# Patient Record
Sex: Female | Born: 1985 | Hispanic: Yes | Marital: Married | Smoking: Never smoker
Health system: Southern US, Community
[De-identification: ages and names within clinical notes are randomized; demographics above are authoritative.]

## PROBLEM LIST (undated history)

## (undated) ENCOUNTER — Inpatient Hospital Stay (HOSPITAL_COMMUNITY): Payer: Self-pay

## (undated) DIAGNOSIS — F32A Depression, unspecified: Secondary | ICD-10-CM

## (undated) DIAGNOSIS — F419 Anxiety disorder, unspecified: Secondary | ICD-10-CM

## (undated) DIAGNOSIS — N189 Chronic kidney disease, unspecified: Secondary | ICD-10-CM

## (undated) HISTORY — PX: NO PAST SURGERIES: SHX2092

## (undated) HISTORY — DX: Chronic kidney disease, unspecified: N18.9

---

## 2006-06-30 ENCOUNTER — Emergency Department (HOSPITAL_COMMUNITY): Admission: EM | Admit: 2006-06-30 | Discharge: 2006-07-01 | Payer: Self-pay | Admitting: Emergency Medicine

## 2006-08-08 ENCOUNTER — Emergency Department (HOSPITAL_COMMUNITY): Admission: EM | Admit: 2006-08-08 | Discharge: 2006-08-09 | Payer: Self-pay | Admitting: Emergency Medicine

## 2007-04-20 ENCOUNTER — Emergency Department (HOSPITAL_COMMUNITY): Admission: EM | Admit: 2007-04-20 | Discharge: 2007-04-20 | Payer: Self-pay | Admitting: Family Medicine

## 2008-06-03 ENCOUNTER — Encounter (INDEPENDENT_AMBULATORY_CARE_PROVIDER_SITE_OTHER): Payer: Self-pay | Admitting: Internal Medicine

## 2008-06-10 ENCOUNTER — Ambulatory Visit: Payer: Self-pay | Admitting: Internal Medicine

## 2008-06-10 DIAGNOSIS — K047 Periapical abscess without sinus: Secondary | ICD-10-CM

## 2008-06-10 DIAGNOSIS — N949 Unspecified condition associated with female genital organs and menstrual cycle: Secondary | ICD-10-CM

## 2008-06-10 DIAGNOSIS — N390 Urinary tract infection, site not specified: Secondary | ICD-10-CM

## 2008-06-11 ENCOUNTER — Encounter (INDEPENDENT_AMBULATORY_CARE_PROVIDER_SITE_OTHER): Payer: Self-pay | Admitting: Internal Medicine

## 2008-06-12 ENCOUNTER — Encounter (INDEPENDENT_AMBULATORY_CARE_PROVIDER_SITE_OTHER): Payer: Self-pay | Admitting: Internal Medicine

## 2008-06-17 ENCOUNTER — Encounter (INDEPENDENT_AMBULATORY_CARE_PROVIDER_SITE_OTHER): Payer: Self-pay | Admitting: Internal Medicine

## 2008-08-19 ENCOUNTER — Ambulatory Visit: Payer: Self-pay | Admitting: Nurse Practitioner

## 2008-09-20 ENCOUNTER — Encounter (INDEPENDENT_AMBULATORY_CARE_PROVIDER_SITE_OTHER): Payer: Self-pay | Admitting: Internal Medicine

## 2008-09-20 ENCOUNTER — Ambulatory Visit: Payer: Self-pay | Admitting: Internal Medicine

## 2008-09-20 DIAGNOSIS — H5310 Unspecified subjective visual disturbances: Secondary | ICD-10-CM | POA: Insufficient documentation

## 2008-09-20 DIAGNOSIS — B373 Candidiasis of vulva and vagina: Secondary | ICD-10-CM | POA: Insufficient documentation

## 2008-09-20 DIAGNOSIS — H9190 Unspecified hearing loss, unspecified ear: Secondary | ICD-10-CM | POA: Insufficient documentation

## 2008-09-20 LAB — CONVERTED CEMR LAB
Chlamydia, DNA Probe: NEGATIVE
Whiff Test: POSITIVE

## 2008-09-29 ENCOUNTER — Encounter (INDEPENDENT_AMBULATORY_CARE_PROVIDER_SITE_OTHER): Payer: Self-pay | Admitting: Internal Medicine

## 2008-10-05 ENCOUNTER — Ambulatory Visit: Payer: Self-pay | Admitting: Internal Medicine

## 2008-10-05 LAB — CONVERTED CEMR LAB
Alkaline Phosphatase: 85 units/L (ref 39–117)
BUN: 11 mg/dL (ref 6–23)
Basophils Absolute: 0.1 10*3/uL (ref 0.0–0.1)
Basophils Relative: 1 % (ref 0–1)
Creatinine, Ser: 0.62 mg/dL (ref 0.40–1.20)
Eosinophils Relative: 1 % (ref 0–5)
Glucose, Bld: 78 mg/dL (ref 70–99)
HDL: 55 mg/dL (ref >39)
Hemoglobin: 14.3 g/dL (ref 12.0–15.0)
LDL Cholesterol: 110 mg/dL — ABNORMAL HIGH (ref 0–99)
MCHC: 33.3 g/dL (ref 30.0–36.0)
Monocytes Absolute: 0.4 10*3/uL (ref 0.1–1.0)
Neutro Abs: 3.7 10*3/uL (ref 1.7–7.7)
RDW: 13.8 % (ref 11.5–15.5)
Sodium: 142 meq/L (ref 135–145)
Total Bilirubin: 1.4 mg/dL — ABNORMAL HIGH (ref 0.3–1.2)
Total CHOL/HDL Ratio: 3.4
Triglycerides: 116 mg/dL (ref <150)
VLDL: 23 mg/dL (ref 0–40)

## 2008-10-22 ENCOUNTER — Encounter (INDEPENDENT_AMBULATORY_CARE_PROVIDER_SITE_OTHER): Payer: Self-pay | Admitting: Internal Medicine

## 2010-12-13 LAB — POCT URINALYSIS DIP (DEVICE)
Nitrite: NEGATIVE
Protein, ur: >=300 — AB
pH: 7

## 2012-01-03 ENCOUNTER — Ambulatory Visit: Payer: Self-pay | Admitting: Family Medicine

## 2012-01-03 VITALS — BP 108/74 | HR 78 | Temp 98.1°F | Resp 16 | Ht 67.0 in | Wt 197.0 lb

## 2012-01-03 DIAGNOSIS — F419 Anxiety disorder, unspecified: Secondary | ICD-10-CM

## 2012-01-03 DIAGNOSIS — R635 Abnormal weight gain: Secondary | ICD-10-CM

## 2012-01-03 DIAGNOSIS — N912 Amenorrhea, unspecified: Secondary | ICD-10-CM

## 2012-01-03 DIAGNOSIS — F411 Generalized anxiety disorder: Secondary | ICD-10-CM

## 2012-01-03 MED ORDER — CLONAZEPAM 0.5 MG PO TABS: 0.5000 mg | ORAL_TABLET | Freq: Two times a day (BID) | ORAL | Status: DC | PRN

## 2012-01-03 NOTE — Patient Instructions (Addendum)
[facility] [place]: [phone].  Can check with them about counseling for anxiety and stress management. Work on the Careers information officer including exercise.  You can take the clonazepam up to twice per day as needed for more severe anxiety symptoms. If you do become pregnant - stop this medicine until discussed with your health care provider. Continue the prenatal vitamins everyday. You can follow up with [facility] for the breast changes, or return to clinic after thyroid tests are back to discuss this further.  Your should receive a call or letter about your lab results within the next week to 10 days.

## 2012-01-03 NOTE — Progress Notes (Signed)
Subjective:    Patient ID: Whitney Bennett, female    DOB: 04-17-1985, 26 y.o.   MRN: [id number]  HPI Whitney Bennett is a 26 y.o. female No menses for past 3 months.  Took multiple pregnancy tests at home. - 2 months ago, 1 months ago, and yesterday.  Breast feel more sore, and appear bigger around areola. Seen by [facility] prior.  G1P1 - 52yo daughter.  Had IUD - Mirena - taken out 2 years ago, due to mood swings, thought had hormonal imbalance.  Tried OCP's few years ago - still had mood swings. Never had eval of mood swings. 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 Feels anxious due to work mostly, has felt more stressed overall past 6 months. 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 Dance helps stress - exercise only once per week.  Eating more with stress. No hx of therapist. Gaining weight past 6 months - some of this is stress eating.  Trying to become pregnant. Taking pnv qd.     Review of Systems  Constitutional: Positive for unexpected weight change.  Genitourinary: Positive for menstrual problem.  Psychiatric/Behavioral: Negative for suicidal ideas and dysphoric mood. The patient is nervous/anxious.    Notes some changes in areolar tissue.  Has noticed a few hairs around area as well.    Objective:   Physical Exam  Constitutional: She is oriented to person, place, and time. She appears well-developed and well-nourished.  HENT:  Head: Normocephalic and atraumatic.  Neck: Normal range of motion. No thyromegaly present.  Cardiovascular: Normal rate, regular rhythm, normal heart sounds and intact distal pulses.   Pulmonary/Chest: Effort normal.       Few small papular areas on areolas bilaterally, no erythema or peau d'orange changes noted.   Abdominal: Soft. Bowel sounds are normal. She exhibits no distension. There is no tenderness.  Lymphadenopathy:    She has no cervical adenopathy.  Neurological: She is alert and oriented to person, place, and time.  Skin: Skin is warm  and dry.  Psychiatric: She has a normal mood and affect. Her behavior is normal. Judgment and thought content normal.      Results for orders placed in visit on 01/03/12  POCT URINE PREGNANCY      Component Value Range   Preg Test, Ur Negative         Assessment & Plan:  Whitney Bennett is a 26 y.o. female 1. Amenorrhea  POCT urine pregnancy, TSH  2. Weight gain  TSH  3. Anxiety  clonazePAM (KLONOPIN) 0.5 MG tablet   Delayed menses.  Suspect stress component. With weight gain will check tsh, but likely stress eating component.  Discussed stress mgt techniques including exercise most days of week, relaxation techniques. Klonopin 0.5mg  bid prn increased sx's. Phone number given for counseling.  Continue PNV QD as trying to become pregnant. Can recheck hcg as outpatient every few weeks.  Stop klonopin if positive hcg.   Subjective breast changes - no concerning findings currently. Can follow up with [facility] or rtc if any new/worsening symptoms.   Patient Instructions  [facility] the [place]: [phone].  Can check with them about counseling for anxiety and stress management. Work on the Careers information officer including exercise.  You can take the clonazepam up to twice per day as needed for more severe anxiety symptoms. If you do become pregnant - stop this medicine until discussed with your health care provider. Continue the prenatal vitamins everyday. You can follow up  with [facility] for the breast changes, or return to clinic after thyroid tests are back to discuss this further.  Your should receive a call or letter about your lab results within the next week to 10 days.

## 2012-04-07 ENCOUNTER — Other Ambulatory Visit: Payer: Self-pay

## 2012-04-07 DIAGNOSIS — Z3200 Encounter for pregnancy test, result unknown: Secondary | ICD-10-CM

## 2012-04-13 ENCOUNTER — Encounter (HOSPITAL_COMMUNITY): Payer: Self-pay | Admitting: *Deleted

## 2012-04-13 ENCOUNTER — Inpatient Hospital Stay (HOSPITAL_COMMUNITY): Payer: Self-pay

## 2012-04-13 ENCOUNTER — Inpatient Hospital Stay (HOSPITAL_COMMUNITY)
Admission: AD | Admit: 2012-04-13 | Discharge: 2012-04-13 | Disposition: A | Discharge status: Home / Self Care | Payer: Self-pay | Source: Ambulatory Visit | Attending: Obstetrics & Gynecology | Admitting: Obstetrics & Gynecology

## 2012-04-13 DIAGNOSIS — R109 Unspecified abdominal pain: Secondary | ICD-10-CM | POA: Insufficient documentation

## 2012-04-13 DIAGNOSIS — O4690 Antepartum hemorrhage, unspecified, unspecified trimester: Secondary | ICD-10-CM

## 2012-04-13 DIAGNOSIS — O209 Hemorrhage in early pregnancy, unspecified: Secondary | ICD-10-CM | POA: Insufficient documentation

## 2012-04-13 HISTORY — DX: Anxiety disorder, unspecified: F41.9

## 2012-04-13 LAB — CBC
HCT: 38.9 % (ref 36.0–46.0)
Hemoglobin: 13.5 g/dL (ref 12.0–15.0)
MCV: 83.5 fL (ref 78.0–100.0)
RDW: 13.8 % (ref 11.5–15.5)
WBC: 11 10*3/uL — ABNORMAL HIGH (ref 4.0–10.5)

## 2012-04-13 LAB — URINE MICROSCOPIC-ADD ON

## 2012-04-13 LAB — URINALYSIS, ROUTINE W REFLEX MICROSCOPIC
Bilirubin Urine: NEGATIVE
Nitrite: NEGATIVE
Specific Gravity, Urine: 1.005 (ref 1.005–1.030)
pH: 7 (ref 5.0–8.0)

## 2012-04-13 LAB — WET PREP, GENITAL: Clue Cells Wet Prep HPF POC: NONE SEEN

## 2012-04-13 NOTE — MAU Provider Note (Signed)
History     CSN: [id number]  Arrival date and time: 04/13/12 1115   First Provider Initiated Contact with Patient 04/13/12 1228      Chief Complaint  Patient presents with  . Vaginal Bleeding  . Abdominal Pain   HPI Whitney Bennett is a 27 y.o. female @ [redacted]w[redacted]d gestation who presents to MAU with vaginal bleeding. The bleeding started today. She describes the bleeding as spotting. Associated symptoms include lower abdominal cramping. Patient states periods are irregular so not sure how far along she is. Took pregnancy test in November that was negative. Last week had positive. Last pap smear one year ago and was normal. Hx of trichomonas. Current sex partner x 6 years. The history was provided by the patient.   OB History    Grav Para Term Preterm Abortions TAB SAB Ect Mult Living   2 1 1       1       Past Medical History  Diagnosis Date  . Anxiety     Past Surgical History  Procedure Date  . No past surgeries     Family History  Problem Relation Age of Onset  . Other Neg Hx     History  Substance Use Topics  . Smoking status: Never Smoker   . Smokeless tobacco: Not on file  . Alcohol Use: 0.5 - 1.0 oz/week    1-2 drink(s) per week    Allergies: No Known Allergies  Prescriptions prior to admission  Medication Sig Dispense Refill  . Prenatal Vit-Fe Fumarate-FA (PRENATAL MULTIVITAMIN) TABS Take 1 tablet by mouth daily.        Review of Systems  Constitutional: Negative for fever, chills and weight loss.  HENT: Negative for ear pain, nosebleeds, congestion, sore throat and neck pain.   Eyes: Negative for blurred vision, double vision, photophobia and pain.  Respiratory: Negative for cough, shortness of breath and wheezing.   Cardiovascular: Negative for chest pain, palpitations and leg swelling.  Gastrointestinal: Positive for nausea and vomiting. Negative for heartburn, abdominal pain, diarrhea and constipation.  Genitourinary: Positive for frequency.  Negative for dysuria and urgency.  Musculoskeletal: Negative for myalgias and back pain.  Skin: Negative for itching and rash.  Neurological: Negative for dizziness, sensory change, speech change, seizures, weakness and headaches.  Endo/Heme/Allergies: Does not bruise/bleed easily.  Psychiatric/Behavioral: Negative for depression and substance abuse. The patient is not nervous/anxious (hx of anxiety, no medication) and does not have insomnia.    Blood pressure 113/68, pulse 86, temperature 98.1 F (36.7 C), temperature source Oral, resp. rate 16, height 5\' 6"  (1.676 m), weight 194 lb (87.998 kg), last menstrual period 01/16/2012.  Physical Exam  Nursing note and vitals reviewed. Constitutional: She is oriented to person, place, and time. She appears well-developed and well-nourished. No distress.  HENT:  Head: Normocephalic and atraumatic.  Eyes: EOM are normal.  Neck: Neck supple.  Cardiovascular: Normal rate.   Respiratory: Effort normal.  GI: Soft. There is no tenderness.       Unable to doppler FHT's  Genitourinary:       External genitalia without lesions, scant blood vaginal vault. Cervix long, closed, no CMT, uterus approximately 8 to 10 week size.   Musculoskeletal: Normal range of motion.  Neurological: She is alert and oriented to person, place, and time.  Skin: Skin is warm and dry.  Psychiatric: She has a normal mood and affect. Her behavior is normal. Judgment and thought content normal.   Results for orders placed  during the hospital encounter of 04/13/12 (from the past 24 hour(s))  URINALYSIS, ROUTINE W REFLEX MICROSCOPIC     Status: Abnormal   Collection Time   04/13/12 11:35 AM      Component Value Range   Color, Urine YELLOW  YELLOW   APPearance CLEAR  CLEAR   Specific Gravity, Urine 1.005  1.005 - 1.030   pH 7.0  5.0 - 8.0   Glucose, UA NEGATIVE  NEGATIVE mg/dL   Hgb urine dipstick MODERATE (*) NEGATIVE   Bilirubin Urine NEGATIVE  NEGATIVE   Ketones, ur  NEGATIVE  NEGATIVE mg/dL   Protein, ur 100 (*) NEGATIVE mg/dL   Urobilinogen, UA 0.2  0.0 - 1.0 mg/dL   Nitrite NEGATIVE  NEGATIVE   Leukocytes, UA SMALL (*) NEGATIVE  URINE MICROSCOPIC-ADD ON     Status: Abnormal   Collection Time   04/13/12 11:35 AM      Component Value Range   Squamous Epithelial / LPF FEW (*) RARE   WBC, UA 3-6  <3 WBC/hpf   RBC / HPF 0-2  <3 RBC/hpf   Bacteria, UA FEW (*) RARE  ABO/RH     Status: Normal   Collection Time   04/13/12  1:20 PM      Component Value Range   ABO/RH(D) O POS    CBC     Status: Abnormal   Collection Time   04/13/12  1:20 PM      Component Value Range   WBC 11.0 (*) 4.0 - 10.5 K/uL   RBC 4.66  3.87 - 5.11 MIL/uL   Hemoglobin 13.5  12.0 - 15.0 g/dL   HCT 38.9  36.0 - 46.0 %   MCV 83.5  78.0 - 100.0 fL   MCH 29.0  26.0 - 34.0 pg   MCHC 34.7  30.0 - 36.0 g/dL   RDW 13.8  11.5 - 15.5 %   Platelets 244  150 - 400 K/uL  HCG, QUANTITATIVE, PREGNANCY     Status: Abnormal   Collection Time   04/13/12  1:21 PM      Component Value Range   hCG, Beta Chain, Laqueta Carina [id number] (*) <5 mIU/mL  WET PREP, GENITAL     Status: Abnormal   Collection Time   04/13/12  1:45 PM      Component Value Range   Yeast Wet Prep HPF POC NONE SEEN  NONE SEEN   Trich, Wet Prep NONE SEEN  NONE SEEN   Clue Cells Wet Prep HPF POC NONE SEEN  NONE SEEN   WBC, Wet Prep HPF POC MANY (*) NONE SEEN    Assessment: 27 y.o. female @ approximately [redacted] weeks gestation with vaginal bleeding   [facility]  Plan:  Start prenatal care   Instruction on threatened AB   Return here as needed.    Procedures   NEESE,HOPE, RN, FNP, [facility] 04/13/2012, 12:28 PM

## 2012-04-13 NOTE — MAU Note (Signed)
C/o spotting and some mild cramping; pain started this Am around 1000;

## 2012-04-13 NOTE — MAU Provider Note (Signed)
Attestation of Attending Supervision of Advanced Practitioner (CNM/NP): Evaluation and management procedures were performed by the Advanced Practitioner under my supervision and collaboration.  I have reviewed the Advanced Practitioner's note and chart, and I agree with the management and plan.  HARRAWAY-SMITH, Jeannene Tschetter 7:52 PM

## 2012-04-14 LAB — GC/CHLAMYDIA PROBE AMP
CT Probe RNA: NEGATIVE
GC Probe RNA: NEGATIVE

## 2012-04-16 LAB — URINE CULTURE

## 2012-07-13 ENCOUNTER — Emergency Department (HOSPITAL_COMMUNITY)
Admission: EM | Admit: 2012-07-13 | Discharge: 2012-07-13 | Disposition: A | Discharge status: Home / Self Care | Payer: No Typology Code available for payment source | Attending: Emergency Medicine | Admitting: Emergency Medicine

## 2012-07-13 ENCOUNTER — Encounter (HOSPITAL_COMMUNITY): Payer: Self-pay | Admitting: Emergency Medicine

## 2012-07-13 DIAGNOSIS — J302 Other seasonal allergic rhinitis: Secondary | ICD-10-CM

## 2012-07-13 DIAGNOSIS — J309 Allergic rhinitis, unspecified: Secondary | ICD-10-CM | POA: Insufficient documentation

## 2012-07-13 DIAGNOSIS — Z331 Pregnant state, incidental: Secondary | ICD-10-CM | POA: Insufficient documentation

## 2012-07-13 DIAGNOSIS — Z8659 Personal history of other mental and behavioral disorders: Secondary | ICD-10-CM | POA: Insufficient documentation

## 2012-07-13 NOTE — ED Notes (Addendum)
Pt reports sore throat onset Friday. Pt c/o shortness of breath and  feeling like throat closing up since 0400. Pt tried tylenol and Vicks vapor rub without relief. Pt currently 5 months pregnant, denies pain in abdomen or discharge.

## 2012-07-13 NOTE — ED Provider Notes (Signed)
History     CSN: [id number]  Arrival date & time 07/13/12  [id number]   First MD Initiated Contact with Patient 07/13/12 [phone]      Chief Complaint  Patient presents with  . Sore Throat    (Consider location/radiation/quality/duration/timing/severity/associated sxs/prior treatment) HPI Comments: Whitney Bennett is a 27 y.o. Female who is here for evaluation of periods of feeling like her throat is closing. The episodes occur spontaneously, and get better when she applies a menthol, rub, to her chest. She has associated sneezing. She denies fever, chills, nausea, vomiting, numbness, or dizziness. She has had some mild sore throat. She currently has an uncomplicated five-month pregnancy. This is her second pregnancy. There are no known modifying factors.  Patient is a 27 y.o. female presenting with pharyngitis. The history is provided by the patient.  Sore Throat    Past Medical History  Diagnosis Date  . Anxiety     Past Surgical History  Procedure Laterality Date  . No past surgeries      Family History  Problem Relation Age of Onset  . Other Neg Hx     History  Substance Use Topics  . Smoking status: Never Smoker   . Smokeless tobacco: Not on file  . Alcohol Use: No     Comment: Quit Nov 2013 due to pregnancy    OB History   Grav Para Term Preterm Abortions TAB SAB Ect Mult Living   2 1 1       1       Review of Systems  All other systems reviewed and are negative.    Allergies  Review of patient's allergies indicates no known allergies.  Home Medications   Current Outpatient Rx  Name  Route  Sig  Dispense  Refill  . Prenatal Vit-Fe Fumarate-FA (PRENATAL MULTIVITAMIN) TABS   Oral   Take 1 tablet by mouth daily.           BP 106/68  Pulse 98  Temp(Src) 98.3 F (36.8 C) (Oral)  Resp 22  SpO2 100%  LMP 01/16/2012  Physical Exam  Nursing note and vitals reviewed. Constitutional: She is oriented to person, place, and time. She appears  well-developed and well-nourished.  HENT:  Head: Normocephalic and atraumatic.  Eyes: Conjunctivae and EOM are normal. Pupils are equal, round, and reactive to light. Right eye exhibits no discharge. Left eye exhibits no discharge. No scleral icterus.  Neck: Normal range of motion and phonation normal. Neck supple.  Cardiovascular: Normal rate, regular rhythm and intact distal pulses.   Pulmonary/Chest: Effort normal and breath sounds normal. No respiratory distress. She has no wheezes. She has no rales. She exhibits no tenderness.  Abdominal: Soft.  gravid  Musculoskeletal: Normal range of motion.  Neurological: She is alert and oriented to person, place, and time. She has normal strength. She exhibits normal muscle tone.  Skin: Skin is warm and dry.  Psychiatric: She has a normal mood and affect. Her behavior is normal. Judgment and thought content normal.    ED Course  Procedures (including critical care time)   Nursing Notes Reviewed/ Care Coordinated, and agree without changes. Applicable Imaging Reviewed.  Interpretation of Laboratory Data incorporated into ED treatment   1. Allergic rhinitis, seasonal       MDM  Symptoms are consistent with seasonal allergy. No systemic symptoms. Doubt arthritis, asthma, pneumonia, pregnancy complication. She is stable for discharge    Plan: Home Medications- Claritin; Home Treatments- rest, stay indoors; Recommended follow up-  OB, as scheduled          Richarda Blade, MD 07/13/12 [phone]

## 2012-08-07 ENCOUNTER — Inpatient Hospital Stay (HOSPITAL_COMMUNITY)
Admission: AD | Admit: 2012-08-07 | Discharge: 2012-08-07 | Disposition: A | Discharge status: Home / Self Care | Payer: No Typology Code available for payment source | Source: Ambulatory Visit | Attending: Obstetrics & Gynecology | Admitting: Obstetrics & Gynecology

## 2012-08-07 ENCOUNTER — Encounter (HOSPITAL_COMMUNITY): Payer: Self-pay

## 2012-08-07 ENCOUNTER — Encounter (HOSPITAL_COMMUNITY): Payer: Self-pay | Admitting: Emergency Medicine

## 2012-08-07 ENCOUNTER — Emergency Department (INDEPENDENT_AMBULATORY_CARE_PROVIDER_SITE_OTHER)
Admission: EM | Admit: 2012-08-07 | Discharge: 2012-08-07 | Disposition: A | Discharge status: Home / Self Care | Payer: No Typology Code available for payment source | Source: Home / Self Care | Attending: Family Medicine | Admitting: Family Medicine

## 2012-08-07 DIAGNOSIS — N949 Unspecified condition associated with female genital organs and menstrual cycle: Secondary | ICD-10-CM

## 2012-08-07 DIAGNOSIS — R319 Hematuria, unspecified: Secondary | ICD-10-CM | POA: Insufficient documentation

## 2012-08-07 DIAGNOSIS — R109 Unspecified abdominal pain: Secondary | ICD-10-CM | POA: Insufficient documentation

## 2012-08-07 DIAGNOSIS — O99891 Other specified diseases and conditions complicating pregnancy: Secondary | ICD-10-CM | POA: Insufficient documentation

## 2012-08-07 DIAGNOSIS — O26899 Other specified pregnancy related conditions, unspecified trimester: Secondary | ICD-10-CM

## 2012-08-07 LAB — URINALYSIS, ROUTINE W REFLEX MICROSCOPIC
Bilirubin Urine: NEGATIVE
Glucose, UA: NEGATIVE mg/dL
Specific Gravity, Urine: 1.01 (ref 1.005–1.030)
pH: 6.5 (ref 5.0–8.0)

## 2012-08-07 LAB — URINE MICROSCOPIC-ADD ON

## 2012-08-07 NOTE — ED Notes (Signed)
Pt adv to f/u at [facility] and to go directly over there.. Pt verbalized understanding.

## 2012-08-07 NOTE — ED Notes (Signed)
Pt c/o abd cramping onset 2 days w/vag d/c; she is [redacted] weeks pregnant abd cramps last for about 5 minutes Denies: dysuria, hematuria, f/v/n/d Was told by GYN to f/u if this keeps bothering her  She is alert and oriented w/no signs of acute distress.

## 2012-08-07 NOTE — ED Provider Notes (Signed)
History     CSN: [id number]  Arrival date & time 08/07/12  1502   First MD Initiated Contact with Patient 08/07/12 1720      Chief Complaint  Patient presents with  . Abdominal Cramping    (Consider location/radiation/quality/duration/timing/severity/associated sxs/prior treatment) Patient is a 27 y.o. female presenting with cramps. The history is provided by the patient.  Abdominal Cramping This is a new problem. The current episode started 2 days ago. The problem has been gradually worsening. Associated symptoms include abdominal pain.    Past Medical History  Diagnosis Date  . Anxiety     Past Surgical History  Procedure Laterality Date  . No past surgeries      Family History  Problem Relation Age of Onset  . Other Neg Hx     History  Substance Use Topics  . Smoking status: Never Smoker   . Smokeless tobacco: Not on file  . Alcohol Use: No     Comment: Quit Nov 2013 due to pregnancy    OB History   Grav Para Term Preterm Abortions TAB SAB Ect Mult Living   2 1 1       1       Review of Systems  Constitutional: Negative.   Gastrointestinal: Positive for abdominal pain.  Genitourinary: Positive for vaginal discharge and pelvic pain. Negative for urgency, frequency, hematuria and vaginal bleeding.       25 wk preg    Allergies  Review of patient's allergies indicates no known allergies.  Home Medications   No current outpatient prescriptions on file.  BP 112/73  Pulse 91  Temp(Src) 97.8 F (36.6 C) (Oral)  Resp 20  SpO2 97%  LMP 01/16/2012  Physical Exam  Nursing note and vitals reviewed. Constitutional: She is oriented to person, place, and time. She appears well-developed and well-nourished.  Abdominal: Soft. Bowel sounds are normal. She exhibits no mass. There is no tenderness. There is no rebound and no guarding.  Gravid uterus  Neurological: She is alert and oriented to person, place, and time.  Skin: Skin is warm and dry.    ED  Course  Procedures (including critical care time)  Labs Reviewed - No data to display No results found.   1. Abdominal pain complicating pregnancy       MDM          Billy Fischer, MD 08/07/12 2052

## 2012-08-07 NOTE — MAU Note (Signed)
Abdominal cramping since yesterday. Seen at [facility] today and checked u/a which showed blood in urine. Told to come here to be checked

## 2012-08-07 NOTE — MAU Provider Note (Signed)
  History     CSN: [id number]  Arrival date and time: 08/07/12 1858   None     Chief Complaint  Patient presents with  . Abdominal Cramping   HPI Ms Whitney Bennett is a 27yo G2P1001 at 25.2wks who presents from [facility] for further eval of abd cramping occuring a couple of times/hr x 2d. Denies dysuria, bldg, leak. No N/V/D or fever. She sees Dr Micah Noel for prenatal care which has been essentially unremarkable.  OB History   Grav Para Term Preterm Abortions TAB SAB Ect Mult Living   2 1 1       1       Past Medical History  Diagnosis Date  . Anxiety     Past Surgical History  Procedure Laterality Date  . No past surgeries      Family History  Problem Relation Age of Onset  . Other Neg Hx     History  Substance Use Topics  . Smoking status: Never Smoker   . Smokeless tobacco: Not on file  . Alcohol Use: No     Comment: Quit Nov 2013 due to pregnancy    Allergies: No Known Allergies  Prescriptions prior to admission  Medication Sig Dispense Refill  . loratadine (CLARITIN) 10 MG tablet Take 10 mg by mouth daily.      . Prenatal Vit-Fe Fumarate-FA (PRENATAL MULTIVITAMIN) TABS Take 1 tablet by mouth daily.        ROS Physical Exam   Blood pressure 117/64, pulse 113, temperature 97.9 F (36.6 C), resp. rate 20, height 5\' 3"  (1.6 m), weight 207 lb 12.8 oz (94.257 kg), last menstrual period 01/16/2012, SpO2 100.00%.  Physical Exam  Constitutional: She is oriented to person, place, and time. She appears well-developed.  HENT:  Head: Normocephalic.  Cardiovascular:  Sl tachycardic upon arrival  Respiratory: Effort normal.  GI: Soft. There is no CVA tenderness.  FHR 135s +accels, no decels No ctx per toco  Genitourinary: Vagina normal.  Cx C/L/post  Musculoskeletal: Normal range of motion.  Neurological: She is alert and oriented to person, place, and time.  Skin: Skin is warm and dry.  Psychiatric: She has a normal mood and affect. Her behavior is normal.  Thought content normal.   Urinalysis    Component Value Date/Time   COLORURINE YELLOW 08/07/2012 2040   APPEARANCEUR CLEAR 08/07/2012 2040   LABSPEC 1.010 08/07/2012 2040   PHURINE 6.5 08/07/2012 2040   GLUCOSEU NEGATIVE 08/07/2012 2040   HGBUR MODERATE* 08/07/2012 2040   BILIRUBINUR NEGATIVE 08/07/2012 2040   KETONESUR NEGATIVE 08/07/2012 2040   PROTEINUR 30* 08/07/2012 2040   UROBILINOGEN 0.2 08/07/2012 2040   NITRITE NEGATIVE 08/07/2012 2040   LEUKOCYTESUR SMALL* 08/07/2012 2040      MAU Course  Procedures    Assessment and Plan  IUP at 25.2wks Round lig pain Hematuria- asymptomatic  D/C home with comfort tips rev'd Urine to culture- will call if result + F/U as scheduled with Dr Murray Hodgkins, [facility] 08/07/2012, 8:44 PM

## 2012-08-09 LAB — URINE CULTURE

## 2012-10-02 ENCOUNTER — Encounter (HOSPITAL_COMMUNITY): Payer: Self-pay

## 2012-10-02 ENCOUNTER — Inpatient Hospital Stay (HOSPITAL_COMMUNITY)
Admission: AD | Admit: 2012-10-02 | Discharge: 2012-10-02 | Disposition: A | Discharge status: Home / Self Care | Payer: No Typology Code available for payment source | Source: Ambulatory Visit | Attending: Obstetrics & Gynecology | Admitting: Obstetrics & Gynecology

## 2012-10-02 DIAGNOSIS — R109 Unspecified abdominal pain: Secondary | ICD-10-CM

## 2012-10-02 DIAGNOSIS — O99891 Other specified diseases and conditions complicating pregnancy: Secondary | ICD-10-CM | POA: Insufficient documentation

## 2012-10-02 DIAGNOSIS — R197 Diarrhea, unspecified: Secondary | ICD-10-CM | POA: Insufficient documentation

## 2012-10-02 DIAGNOSIS — M549 Dorsalgia, unspecified: Secondary | ICD-10-CM | POA: Insufficient documentation

## 2012-10-02 DIAGNOSIS — O26899 Other specified pregnancy related conditions, unspecified trimester: Secondary | ICD-10-CM

## 2012-10-02 LAB — URINE MICROSCOPIC-ADD ON

## 2012-10-02 LAB — URINALYSIS, ROUTINE W REFLEX MICROSCOPIC
Nitrite: NEGATIVE
Specific Gravity, Urine: 1.025 (ref 1.005–1.030)
Urobilinogen, UA: 0.2 mg/dL (ref 0.0–1.0)

## 2012-10-02 NOTE — MAU Provider Note (Signed)
History     CSN: [id number]  Arrival date and time: 10/02/12 1452   None     Chief Complaint  Patient presents with  . Abdominal Pain   HPI 27 y.o. G2P1001 at [redacted]w[redacted]d here for abdominal/back pain. Pain started last night, primarily in back but also around lower abdomen. Comes in waves, about q17mins, lasts 15mins. The frequency and duration has decreased today. She also had some small amount of spotting yesterday. No loss of fluid. Still feels the baby moving. She has a history of frequent UTIs, of which she was just treated for one. Denies chest pain, SOB, headache, dizziness, changes in vision  Prenatal course: Care with Dr. Marcheta Grammes at [facility] - No records here, but patient states no problems with pregnancy other than UTIs  OB History   Grav Para Term Preterm Abortions TAB SAB Ect Mult Living   2 1 1       1       Past Medical History  Diagnosis Date  . Anxiety     Past Surgical History  Procedure Laterality Date  . No past surgeries      Family History  Problem Relation Age of Onset  . Other Neg Hx     History  Substance Use Topics  . Smoking status: Never Smoker   . Smokeless tobacco: Not on file  . Alcohol Use: No     Comment: Quit Nov 2013 due to pregnancy    Allergies: No Known Allergies  Prescriptions prior to admission  Medication Sig Dispense Refill  . amoxicillin (AMOXIL) 500 MG capsule Take 500 mg by mouth 3 (three) times daily.      Marland Kitchen loratadine (CLARITIN) 10 MG tablet Take 10 mg by mouth daily as needed for allergies.       . Prenatal Vit-Fe Fumarate-FA (PRENATAL MULTIVITAMIN) TABS Take 1 tablet by mouth daily.        ROS Physical Exam   Blood pressure 121/68, pulse 94, temperature 98 F (36.7 C), temperature source Oral, resp. rate 18, height 5' 4.25" (1.632 m), weight 97.977 kg (216 lb), last menstrual period 01/16/2012.  Physical Exam General appearance: alert, cooperative and no distress Head: Normocephalic, without obvious abnormality,  atraumatic Back: no CVA tenderness, no tenderness along spine Lungs: clear to auscultation bilaterally Heart: regular rate and rhythm, S1, S2 normal, no murmur, click, rub or gallop Abdomen: gravid, nontender to palpation, fundal height consistent with GA Pelvic: cervix normal in appearance and external genitalia normal Extremities: edema 1+ to mid lower leg bilaterally Pulses: 2+ and symmetric  Spec: greenish white thick mucous, no blood, cervix closed  Dilation: Closed Effacement (%): Thick Exam by:: Dr. Lamar Benes  FHT: 145bpm, mod var, 15x15 accels present, 1 variable decel down to 105 Toco: no ctx  MAU Course  Procedures Results for orders placed during the hospital encounter of 10/02/12 (from the past 24 hour(s))  URINALYSIS, ROUTINE W REFLEX MICROSCOPIC     Status: Abnormal   Collection Time    10/02/12  3:25 PM      Result Value Range   Color, Urine YELLOW  YELLOW   APPearance HAZY (*) CLEAR   Specific Gravity, Urine 1.025  1.005 - 1.030   pH 7.0  5.0 - 8.0   Glucose, UA NEGATIVE  NEGATIVE mg/dL   Hgb urine dipstick MODERATE (*) NEGATIVE   Bilirubin Urine NEGATIVE  NEGATIVE   Ketones, ur NEGATIVE  NEGATIVE mg/dL   Protein, ur 100 (*) NEGATIVE mg/dL   Urobilinogen,  UA 0.2  0.0 - 1.0 mg/dL   Nitrite NEGATIVE  NEGATIVE   Leukocytes, UA TRACE (*) NEGATIVE  URINE MICROSCOPIC-ADD ON     Status: Abnormal   Collection Time    10/02/12  3:25 PM      Result Value Range   Squamous Epithelial / LPF MANY (*) RARE   WBC, UA 7-10  <3 WBC/hpf   RBC / HPF 0-2  <3 RBC/hpf  WET PREP, GENITAL     Status: Abnormal   Collection Time    10/02/12  5:10 PM      Result Value Range   Yeast Wet Prep HPF POC NONE SEEN  NONE SEEN   Trich, Wet Prep NONE SEEN  NONE SEEN   Clue Cells Wet Prep HPF POC NONE SEEN  NONE SEEN   WBC, Wet Prep HPF POC MANY (*) NONE SEEN    MDM   Assessment and Plan  27 y.o. G2P1001 [redacted]w[redacted]d   UA negative for UTI Wet prep negative FHT reassurring  Pain likely  exacerbated by recent stress at work  Stable for discharge  Tawanna Sat 10/02/2012, 4:56 PM   I agree with above resident note. I reviewed history, imaging, labs, and vitals. I personally reviewed the fetal heart tracing, and it is reactive. Martha Clan, MD

## 2012-10-02 NOTE — MAU Note (Signed)
Baby dropped a few days ago.  Has been feeling cramping since yesterday, doesn't know if contracting.  Also having low back ache, recently treated  For UTI.  Has been having diarrhea.

## 2012-10-02 NOTE — MAU Note (Signed)
See Dr. Acquanetta Sit in [facility] for prenatal care. Denies loss of fluid, has had recurrent UTIs during pregnancy.

## 2012-10-03 LAB — GC/CHLAMYDIA PROBE AMP: GC Probe RNA: NEGATIVE

## 2012-10-05 NOTE — MAU Provider Note (Signed)
Attestation of Attending Supervision of Obstetric Fellow: Evaluation and management procedures were performed by the Obstetric Fellow under my supervision and collaboration.  I have reviewed the Obstetric Fellow's note and chart, and I agree with the management and plan.  Charlii Yost, MD, FACOG Attending Obstetrician & Gynecologist Faculty Practice, [facility]   

## 2012-11-16 ENCOUNTER — Inpatient Hospital Stay (HOSPITAL_COMMUNITY)
Admission: AD | Admit: 2012-11-16 | Discharge: 2012-11-16 | Disposition: A | Discharge status: Home / Self Care | Payer: Self-pay | Source: Ambulatory Visit | Attending: Obstetrics & Gynecology | Admitting: Obstetrics & Gynecology

## 2012-11-16 ENCOUNTER — Inpatient Hospital Stay (HOSPITAL_COMMUNITY): Payer: Self-pay

## 2012-11-16 ENCOUNTER — Encounter (HOSPITAL_COMMUNITY): Payer: Self-pay | Admitting: *Deleted

## 2012-11-16 DIAGNOSIS — O99891 Other specified diseases and conditions complicating pregnancy: Secondary | ICD-10-CM | POA: Insufficient documentation

## 2012-11-16 DIAGNOSIS — R109 Unspecified abdominal pain: Secondary | ICD-10-CM

## 2012-11-16 DIAGNOSIS — O368132 Decreased fetal movements, third trimester, fetus 2: Secondary | ICD-10-CM

## 2012-11-16 DIAGNOSIS — O479 False labor, unspecified: Secondary | ICD-10-CM

## 2012-11-16 DIAGNOSIS — O4690 Antepartum hemorrhage, unspecified, unspecified trimester: Secondary | ICD-10-CM

## 2012-11-16 DIAGNOSIS — O4693 Antepartum hemorrhage, unspecified, third trimester: Secondary | ICD-10-CM

## 2012-11-16 DIAGNOSIS — O368190 Decreased fetal movements, unspecified trimester, not applicable or unspecified: Secondary | ICD-10-CM

## 2012-11-16 DIAGNOSIS — Z22330 Carrier of Group B streptococcus: Secondary | ICD-10-CM | POA: Insufficient documentation

## 2012-11-16 LAB — URINALYSIS, ROUTINE W REFLEX MICROSCOPIC
Nitrite: NEGATIVE
Specific Gravity, Urine: 1.025 (ref 1.005–1.030)
pH: 7 (ref 5.0–8.0)

## 2012-11-16 LAB — URINE MICROSCOPIC-ADD ON

## 2012-11-16 LAB — WET PREP, GENITAL: Trich, Wet Prep: NONE SEEN

## 2012-11-16 NOTE — MAU Note (Signed)
Pt presents with complaints of abdominal pain and says it feels like stabbing pain in her lower abdomen. She also states that she has had a decrease in fetal movement. States some spotting on Thursday and she had to wear a panty liner very light vaginal bleeding.

## 2012-11-16 NOTE — MAU Provider Note (Signed)
Chief Complaint:  Abdominal Pain and Decreased Fetal Movement   Whitney Bennett is a 27 y.o.  G2P1001 with IUP at [redacted]w[redacted]d presenting for Abdominal Pain and Decreased Fetal Movement  States started having contractions 4 days ago- occurred every 52min and then increased to every 67min. Then they seemed to resolve 3 days ago.  Since 3 days ago, she is feeling less fetal movement. Still feeling some but not as much.  Also having sharp pains around her belly button that come and go.  She did have  a single episode of dried blood spotting that required a panty liner 2 days ago but no bleeding since. Overall wanted to make sure things were ok so presented for eval.    No contractions since Saturday.  No LOF.  No fevers, chills, vaginal discharge, dysuria, nausea, vomiting.    Prenatal course:  Care with Dr. Marcheta Grammes at [facility]  - No records here, but patient states no problems with pregnancy other than UTIs    Menstrual History: OB History   Grav Para Term Preterm Abortions TAB SAB Ect Mult Living   2 1 1       1      G1- SVD, girl, 7lb12oz  Patient's last menstrual period was 01/16/2012.      Past Medical History  Diagnosis Date  . Anxiety     Past Surgical History  Procedure Laterality Date  . No past surgeries      Family History  Problem Relation Age of Onset  . Other Neg Hx     History  Substance Use Topics  . Smoking status: Never Smoker   . Smokeless tobacco: Not on file  . Alcohol Use: No     Comment: Quit Nov 2013 due to pregnancy     No Known Allergies  Prescriptions prior to admission  Medication Sig Dispense Refill  . acetaminophen (TYLENOL) 325 MG tablet Take 650 mg by mouth daily as needed for pain.      . Prenatal Vit-Fe Fumarate-FA (PRENATAL MULTIVITAMIN) TABS Take 1 tablet by mouth daily.        Review of Systems - Negative except for what is mentioned in HPI.  Physical Exam  Blood pressure 123/65, pulse 96, temperature 97.5 F (36.4 C),  resp. rate 18, height 5\' 6"  (1.676 m), weight 101.606 kg (224 lb), last menstrual period 01/16/2012. GENERAL: Well-developed, well-nourished female in no acute distress.  LUNGS: Clear to auscultation bilaterally.  HEART: Regular rate and rhythm. ABDOMEN: Soft, nontender, nondistended, gravid.  EXTREMITIES: Nontender, no edema, 2+ distal pulses. SSE: normal vulva, urethra, vagina. Cervix friable and with manipulation, approx 20cc of bright red blood presented out of the external os. Scant white creamy discharge.    Cervical Exam: Dilatation 1cm   Effacement 80%   Station -3   Presentation: cephalic FHT:  Baseline rate 130 bpm   Variability moderate  Accelerations present   Decelerations none Contractions: irritability   Labs: No results found for this or any previous visit (from the past 24 hour(s)).  Imaging Studies:  No results found.  Assessment: Whitney Bennett is  28 y.o. G2P1001 at [redacted]w[redacted]d by early [place] presents with Abdominal Pain and Decreased Fetal Movement .  Plan: 1) abdominal pain - rare, intermittent cramping - irritability on the monitor - UA negative for infection, wet prep with WBCs,sent GC/CHL also - likely either braxton hicks vs due to infection  2) vaginal bleeding - in the setting of mild abdominal pain, initial concernf  or occult abruption. Pt was monitored for 3 hours and FHT was reactive. Bleeding was scant after exam - cervix was re-examined after this time and found to be friable with the vaginal vault being dry of blood - suspect bleeding is from a cervical source and likely today due to exam and Saturday due to intercourse late Friday night.   - US obtained as we had none in the records that showed placenta to be anterior and above the os - reassurance and bleeding precautions discussed with reasons to return.   3) Decreased FM - reactive NST- cat I for 3 hours here in MAU - reassurance given - kick counts discussed  4) records - records of  labs obtained from Dr. Marcheta Grammes as she plans to deliver here - GBS +, all others unremarkable  F/u with Dr. Marcheta Grammes as scheduled. Return precautions discussed.     Mollye Guinta L 8/25/20149:35 AM

## 2012-11-16 NOTE — MAU Provider Note (Signed)
Attestation of Attending Supervision of Advanced Practitioner (CNM/NP): Evaluation and management procedures were performed by the Advanced Practitioner under my supervision and collaboration.  I have reviewed the Advanced Practitioner's note and chart, and I agree with the management and plan.  HARRAWAY-SMITH, Selenia Mihok 10:24 PM

## 2012-11-17 LAB — GC/CHLAMYDIA PROBE AMP
CT Probe RNA: NEGATIVE
GC Probe RNA: NEGATIVE

## 2012-11-18 ENCOUNTER — Encounter (HOSPITAL_COMMUNITY): Payer: Self-pay | Admitting: *Deleted

## 2012-11-18 ENCOUNTER — Inpatient Hospital Stay (HOSPITAL_COMMUNITY)
Admission: AD | Admit: 2012-11-18 | Discharge: 2012-11-18 | Disposition: A | Discharge status: Home / Self Care | Payer: Self-pay | Source: Ambulatory Visit | Attending: Family Medicine | Admitting: Family Medicine

## 2012-11-18 DIAGNOSIS — O4690 Antepartum hemorrhage, unspecified, unspecified trimester: Secondary | ICD-10-CM | POA: Insufficient documentation

## 2012-11-18 DIAGNOSIS — R109 Unspecified abdominal pain: Secondary | ICD-10-CM | POA: Insufficient documentation

## 2012-11-18 LAB — WET PREP, GENITAL
Clue Cells Wet Prep HPF POC: NONE SEEN
Trich, Wet Prep: NONE SEEN

## 2012-11-18 NOTE — Discharge Instructions (Signed)
Vaginal Bleeding During Pregnancy, Third Trimester A small amount of bleeding (spotting) is relatively common in pregnancy. Sometimes bleeding may be "normal." Bleeding in the third trimester can be very serious for the mother and the baby, and should be reported to your caregiver right away. It is very important to follow your caregiver's instructions. CAUSES Possible causes of bleeding during the third trimester:  The placenta may be partially covering or completely covering the opening to the cervix (placenta previa).  The placenta may have separated from the uterus (abruption of the placenta).  There may be an infection or growth on the cervix.  You may be starting labor, called discharging of the mucus plug.  The placenta may grow into the muscle layer of the uterus (placenta accreta). DIAGNOSIS  Your caregiver may do:  Pelvic exam in the delivery room, called a double set up (being ready to do an emergency cesarean delivery, if necessary).  Blood tests, to see if you are anemic (not having enough red blood cells).  Ultrasound and fetal monitoring, to see if the baby is having problems.  Ultrasound, to find out why you are bleeding. TREATMENT   You may need to remain in the hospital.  You may be given an IV (intravenous) while you are in the hospital.  You may be put on strict bed rest.  You may need a blood transfusion.  You may be placed on oxygen.  The baby may need to be delivered immediately. HOME CARE INSTRUCTIONS   Your caregiver may order bed rest (getting up to go to the bathroom only). At this time, you may need to make arrangements for the care of children and for other responsibilities.  Keep track of the number of pads you use each day and how soaked (saturated) they are. Write this down.  Do not use tampons. Do not douche.  Do not have sexual intercourse or any sexual activity that may cause an orgasm, until approved by your caregiver.  Follow your  caregiver's advice about lifting, driving, physical and social activities.  Eat a balanced and nutritious diet.  Get plenty of rest and sleep.  Do not drink alcohol or smoke. SEEK IMMEDIATE MEDICAL CARE IF:   You experience severe cramps or pain in your back or belly (abdomen).  You have an oral temperature above 102 F (38.9 C), not controlled by medicine.  You develop chills.  You have a gush of fluid from the vagina.  You pass large clots or tissue. Save any tissue for your caregiver to inspect.  Your bleeding increases or you become light-headed or weak.  You pass out.  You feel less movement or no movement of the baby. Document Released: 06/01/2002 Document Revised: 06/03/2011 Document Reviewed: 02/06/2009 [facility] Patient Information 2014 [place], [place].

## 2012-11-18 NOTE — MAU Note (Signed)
Was seen here on Monday, told my placenta was coming off a little and to return if i had any more bleeding.  Walking today and started having sharp pains near belly button and noticed some blood with wiping and in my underwear

## 2012-11-18 NOTE — MAU Provider Note (Signed)
  History     CSN: [id number]  Arrival date and time: 11/18/12 1932   None     Chief Complaint  Patient presents with  . Vaginal Bleeding  . Abdominal Pain   HPI  Genesha Colangelo is a 27 y.o. G2P1001 at [redacted]w[redacted]d who presents with vaginal bleeding. Was here several days ago with vaginal bleeding, which was thought to be due to cervical friability and recent intercourse. Today patient noticed spotting on her underwear, was also present with wiping when she went to the bathroom. She denies having intercourse over the past several weeks. She does report a sharp pain on the top of her abdomen which comes and goes. Cannot identify any triggers. Is not having pain right now. On Thursday she had a mucous-like discharge but none since. Baby is moving well. Denies fluid leaking. Feels contractions about once every 30-40 minutes.  OB History   Grav Para Term Preterm Abortions TAB SAB Ect Mult Living   2 1 1       1       Past Medical History  Diagnosis Date  . Anxiety     Past Surgical History  Procedure Laterality Date  . No past surgeries      Family History  Problem Relation Age of Onset  . Other Neg Hx     History  Substance Use Topics  . Smoking status: Never Smoker   . Smokeless tobacco: Not on file  . Alcohol Use: No     Comment: Quit Nov 2013 due to pregnancy    Allergies: No Known Allergies  Prescriptions prior to admission  Medication Sig Dispense Refill  . acetaminophen (TYLENOL) 325 MG tablet Take 650 mg by mouth daily as needed for pain.      . Prenatal Vit-Fe Fumarate-FA (PRENATAL MULTIVITAMIN) TABS Take 1 tablet by mouth daily.        ROS +ctx, + vag bleeding, no LOF, +fetal movement Physical Exam   Blood pressure 117/72, pulse 89, temperature 97.6 F (36.4 C), temperature source Oral, resp. rate 18, height 5\' 6"  (1.676 m), weight 224 lb (101.606 kg), last menstrual period 01/16/2012.  Physical Exam Gen: NAD Lungs: NWOB Abd: gravid, otherwise  soft, mildly tender to palpation Neuro: grossly nonfocal, speech intact GU: normal appearing external genitalia. Speculum exam shows no blood in the vaginal vault. +thick white discharge present. Cervix appears friable. Dilation: 1 Effacement (%): 50 Cervical Position: Posterior Station: -1 Presentation: Vertex Exam by:: Deboraha Sprang, RN   MAU Course  Procedures  MDM Wet prep: only positive for white cells. No yeast, trich, clue cells  FHR: baseline 130s-140s, moderate variability, positive accels, no decels Toco: occasional ctx q5-10 minutes   Assessment and Plan  Idelia Oluwatofunmi Gironda is a 27 y.o. G2P1001 at [redacted]w[redacted]d who presents complaining of vaginal bleeding. No bleeding seen in vaginal vault. Cervix is only 1cm dilated. Had recent gc/chlamydia which were negative. Wet prep unrevealing today. No signs of active labor or concerning vaginal bleeding. Likely secondary to cervical friability. Offered reassurance to patient. FHR category 1. Stable for discharge.  Chrisandra Netters, MD Family Medicine PGY-2  11/18/2012, 10:11 PM   I have seen and examined this patient and I agree with the above. Serita Grammes 5:27 AM 11/19/2012

## 2012-11-19 NOTE — MAU Provider Note (Signed)
Attestation of Attending Supervision of Advanced Practitioner: Evaluation and management procedures were performed by the PA/NP/CNM/OB Fellow under my supervision/collaboration. Chart reviewed and agree with management and plan.  Jaydrian Corpening V 11/19/2012 10:03 AM

## 2013-02-16 ENCOUNTER — Encounter (HOSPITAL_COMMUNITY): Payer: Self-pay | Admitting: *Deleted

## 2014-01-24 ENCOUNTER — Encounter (HOSPITAL_COMMUNITY): Payer: Self-pay | Admitting: *Deleted

## 2015-10-20 ENCOUNTER — Ambulatory Visit (INDEPENDENT_AMBULATORY_CARE_PROVIDER_SITE_OTHER): Payer: BLUE CROSS/BLUE SHIELD

## 2015-10-20 ENCOUNTER — Ambulatory Visit (HOSPITAL_COMMUNITY)
Admission: EM | Admit: 2015-10-20 | Discharge: 2015-10-20 | Disposition: A | Discharge status: Home / Self Care | Payer: BLUE CROSS/BLUE SHIELD | Attending: Emergency Medicine | Admitting: Emergency Medicine

## 2015-10-20 DIAGNOSIS — R05 Cough: Secondary | ICD-10-CM

## 2015-10-20 DIAGNOSIS — J069 Acute upper respiratory infection, unspecified: Secondary | ICD-10-CM | POA: Diagnosis not present

## 2015-10-20 DIAGNOSIS — R059 Cough, unspecified: Secondary | ICD-10-CM

## 2015-10-20 MED ORDER — AEROCHAMBER PLUS MISC
2 refills | Status: DC
Start: 2015-10-20 — End: 2015-11-14

## 2015-10-20 MED ORDER — HYDROCODONE-HOMATROPINE 5-1.5 MG/5ML PO SYRP: 5.0000 mL | ORAL_SOLUTION | Freq: Four times a day (QID) | ORAL | 0 refills | Status: DC | PRN

## 2015-10-20 MED ORDER — ALBUTEROL SULFATE HFA 108 (90 BASE) MCG/ACT IN AERS: 1.0000 | INHALATION_SPRAY | Freq: Four times a day (QID) | RESPIRATORY_TRACT | 0 refills | Status: DC | PRN

## 2015-10-20 MED ORDER — IBUPROFEN 800 MG PO TABS: 800.0000 mg | ORAL_TABLET | Freq: Three times a day (TID) | ORAL | 0 refills | Status: DC

## 2015-10-20 MED ORDER — BENZONATATE 200 MG PO CAPS: 200.0000 mg | ORAL_CAPSULE | Freq: Three times a day (TID) | ORAL | 0 refills | Status: DC | PRN

## 2015-10-20 MED ORDER — AZITHROMYCIN 250 MG PO TABS: 250.0000 mg | ORAL_TABLET | Freq: Every day | ORAL | 0 refills | Status: DC

## 2015-10-20 NOTE — ED Triage Notes (Signed)
C/o cough and fever States she has cough so much her chest hurts Used otc meds as tx

## 2015-10-20 NOTE — Discharge Instructions (Signed)
2 puffs from your albuterol inhaler every 4-6 hours as needed for coughing and wheezing. Ibuprofen 800 mg 3 times a day to help with the chest soreness. Cough syrup at night to help with sleep. Finish the antibiotics. Follow up with a primary care physician of your choice, see list below   [facility] Sickle Cell/Family Medicine/Internal Medicine [phone] [address] [place] [address]  [facility]: [place]  [phone]  [facility]: [place]   [phone]  [facility]: [address] [place]  [phone]   primary care : [address] [phone]  [facility]: [address] [place] [phone]  [facility]: [place] [phone]  Dr. Blanchie Serve [place]  [phone]  Dr. Benito Mccreedy, [facility]. [place],  [address]  [phone]

## 2015-10-20 NOTE — ED Provider Notes (Signed)
HPI  SUBJECTIVE:  Whitney Bennett is a 30 y.o. female who presents with nonproductive cough, wheezing, substernal achy chest soreness only after coughing. She reports shortness of breath with exertion, bodyaches. She report states that symptoms are better with drinking warm tea, worse with exertion. She has also tried cough drops and over-the-counter cold medicines. States that she had a fever of 100.7 last night, states that she is getting worse, not getting better. She denies nasal congestion, rhinorrhea, sore throat, although reports that her throat "itches". No sinus pain or pressure, postnasal drip. States that she is unable to sleep at night secondary to the coughing. No sick contacts. No antipyretic in the past 6-8 hours. Past medical history negative for asthma, emphysema, COPD. She is a smoker. No history of diabetes, hypertension, CHF. LMP: First week of July, she denies possibility of being pregnant. PMD: None.     Past Medical History:  Diagnosis Date  . Anxiety     Past Surgical History:  Procedure Laterality Date  . NO PAST SURGERIES      Family History  Problem Relation Age of Onset  . Other Neg Hx     Social History  Substance Use Topics  . Smoking status: Never Smoker  . Smokeless tobacco: Not on file  . Alcohol use No     Comment: Quit Nov 2013 due to pregnancy    No current facility-administered medications for this encounter.   Current Outpatient Prescriptions:  .  acetaminophen (TYLENOL) 325 MG tablet, Take 650 mg by mouth daily as needed for pain., Disp: , Rfl:  .  Prenatal Vit-Fe Fumarate-FA (PRENATAL MULTIVITAMIN) TABS, Take 1 tablet by mouth daily., Disp: , Rfl:  .  albuterol (PROVENTIL HFA;VENTOLIN HFA) 108 (90 Base) MCG/ACT inhaler, Inhale 1-2 puffs into the lungs every 6 (six) hours as needed for wheezing or shortness of breath., Disp: 1 Inhaler, Rfl: 0 .  azithromycin (ZITHROMAX) 250 MG tablet, Take 1 tablet (250 mg total) by mouth daily. 2  tabs po on day 1, 1 tab po on days 2-5, Disp: 6 tablet, Rfl: 0 .  benzonatate (TESSALON) 200 MG capsule, Take 1 capsule (200 mg total) by mouth 3 (three) times daily as needed for cough., Disp: 20 capsule, Rfl: 0 .  HYDROcodone-homatropine (HYCODAN) 5-1.5 MG/5ML syrup, Take 5 mLs by mouth every 6 (six) hours as needed for cough., Disp: 120 mL, Rfl: 0 .  ibuprofen (ADVIL,MOTRIN) 800 MG tablet, Take 1 tablet (800 mg total) by mouth 3 (three) times daily., Disp: 30 tablet, Rfl: 0 .  Spacer/Aero-Holding Chambers (AEROCHAMBER PLUS) inhaler, Use as instructed, Disp: 1 each, Rfl: 2  No Known Allergies   ROS  As noted in HPI.   Physical Exam  BP 125/74 (BP Location: Left Arm)   Pulse 101   Temp 98.9 F (37.2 C) (Oral)   Resp 16   LMP 09/27/2015   SpO2 97%   Constitutional: Well developed, well nourished, no acute distress Eyes:  EOMI, conjunctiva normal bilaterally HENT: Normocephalic, atraumatic,mucus membranes moist. TMs normal bilaterally. Mild nasal congestion. No sinus tenderness. Normal oropharynx. Positive cobblestoning. No postnasal drip.  Respiratory: Normal inspiratory effortGood air movement, lungs clear bilaterally. Positive diffuse chest wall tenderness  Cardiovascular: Normal rate Regular rhythm, no murmurs rubs gallops  GI: nondistended skin: No rash, skin intact Musculoskeletal: no deformities Neurologic: Alert & oriented x 3, no focal neuro deficits Psychiatric: Speech and behavior appropriate   ED Course   Medications - No data to display  Orders  Placed This Encounter  Procedures  . DG Chest 2 View    Standing Status:   Standing    Number of Occurrences:   1    Order Specific Question:   Reason for Exam (SYMPTOM  OR DIAGNOSIS REQUIRED)    Answer:   cough fever r/o PNA    No results found for this or any previous visit (from the past 24 hour(s)). Dg Chest 2 View  Result Date: 10/20/2015 CLINICAL DATA:  Cough and fever and chest congestion. Chest pain from  coughing. EXAM: CHEST  2 VIEW COMPARISON:  None FINDINGS: The heart size and mediastinal contours are within normal limits. Both lungs are clear. The visualized skeletal structures are unremarkable. IMPRESSION: Normal chest. Electronically Signed   By: Lorriane Shire M.D.   On: 10/20/2015 14:48   ED Clinical Impression  URI (upper respiratory infection)  Cough  ED Assessment/Plan Checking chest x-ray to rule out pneumonia.   Reviewed imaging independently. Lungs clear bilaterally. See radiology report for details.   We'll send home with albuterol, Tessalon Perles, cough syrup, azithromycin given that she is getting worse and with fevers last night. Will provide primary care referral her to follow up with as needed and for routine care. Discussed imaging, medical decision-making, signs and symptoms that should prompt return to the emergency department. Patient agrees with plan  .   *This clinic note was created using Dragon dictation software. Therefore, there may be occasional mistakes despite careful proofreading.  ?   Melynda Ripple, MD 10/20/15 [phone]

## 2015-11-14 ENCOUNTER — Other Ambulatory Visit: Payer: Self-pay | Admitting: Gynecology

## 2015-11-14 ENCOUNTER — Ambulatory Visit (INDEPENDENT_AMBULATORY_CARE_PROVIDER_SITE_OTHER): Payer: BLUE CROSS/BLUE SHIELD | Admitting: Gynecology

## 2015-11-14 ENCOUNTER — Encounter: Payer: Self-pay | Admitting: Gynecology

## 2015-11-14 VITALS — BP 124/80 | Ht 67.0 in | Wt 226.0 lb

## 2015-11-14 DIAGNOSIS — N644 Mastodynia: Secondary | ICD-10-CM | POA: Diagnosis not present

## 2015-11-14 DIAGNOSIS — Z01419 Encounter for gynecological examination (general) (routine) without abnormal findings: Secondary | ICD-10-CM

## 2015-11-14 DIAGNOSIS — Z3009 Encounter for other general counseling and advice on contraception: Secondary | ICD-10-CM

## 2015-11-14 NOTE — Addendum Note (Signed)
Addended by: Nelva Nay on: 11/14/2015 11:50 AM   Modules accepted: Orders

## 2015-11-14 NOTE — Progress Notes (Signed)
Whitney Bennett Aug 01, 1985 [id number]   History:    30 y.o.  for annual gyn exam is a new patient to the practice. Patient is a gravida 2 para 2 (normal spontaneous vaginal delivery) who has been using condoms for contraception. She reports recent normal menstrual cycles but at time she has skipped up to 2 months at a time. She breast-fed her last child until approximately one year ago. She wanted to discuss different contraceptive options. Patient denies any past history of any abnormal Pap smears. Patient never received the HPV vaccine when she was younger.  Past medical history,surgical history, family history and social history were all reviewed and documented in the EPIC chart.  Gynecologic History Patient's last menstrual period was 11/10/2015 (exact date). Contraception: condoms Last Pap: Several years ago. Results were: normal Last mammogram: Not indicated. Results were: Not indicated  Obstetric History OB History  Gravida Para Term Preterm AB Living  2 1 1    0 2  SAB TAB Ectopic Multiple Live Births      0   1    # Outcome Date GA Lbr Len/2nd Weight Sex Delivery Anes PTL Lv  2 Gravida              Birth Comments: System Generated. Please review and update pregnancy details.  1 Term     F Vag-Spont EPI  LIV       ROS: A ROS was performed and pertinent positives and negatives are included in the history.  GENERAL: No fevers or chills. HEENT: No change in vision, no earache, sore throat or sinus congestion. NECK: No pain or stiffness. CARDIOVASCULAR: No chest pain or pressure. No palpitations. PULMONARY: No shortness of breath, cough or wheeze. GASTROINTESTINAL: No abdominal pain, nausea, vomiting or diarrhea, melena or bright red blood per rectum. GENITOURINARY: No urinary frequency, urgency, hesitancy or dysuria. MUSCULOSKELETAL: No joint or muscle pain, no back pain, no recent trauma. DERMATOLOGIC: No rash, no itching, no lesions. ENDOCRINE: No polyuria, polydipsia,  no heat or cold intolerance. No recent change in weight. HEMATOLOGICAL: No anemia or easy bruising or bleeding. NEUROLOGIC: No headache, seizures, numbness, tingling or weakness. PSYCHIATRIC: No depression, no loss of interest in normal activity or change in sleep pattern.     Exam: chaperone present  BP 124/80   Ht 5\' 7"  (1.702 m)   Wt 226 lb (102.5 kg)   LMP 11/10/2015 (Exact Date)   Breastfeeding? No   BMI 35.40 kg/m   Body mass index is 35.4 kg/m.  General appearance : Well developed well nourished female. No acute distress HEENT: Eyes: no retinal hemorrhage or exudates,  Neck supple, trachea midline, no carotid bruits, no thyroidmegaly Lungs: Clear to auscultation, no rhonchi or wheezes, or rib retractions  Heart: Regular rate and rhythm, no murmurs or gallops Breast:Examined in sitting and supine position were symmetrical in appearance, no palpable masses or tenderness,  no skin retraction, no nipple inversion, no nipple discharge, no skin discoloration, no axillary or supraclavicular lymphadenopathy Abdomen: no palpable masses or tenderness, no rebound or guarding Extremities: no edema or skin discoloration or tenderness  Pelvic:  Bartholin, Urethra, Skene Glands: Within normal limits             Vagina: No gross lesions or discharge  Cervix: No gross lesions or discharge  Uterus  anteverted, normal size, shape and consistency, non-tender and mobile  Adnexa  Without masses or tenderness  Anus and perineum  normal   Rectovaginal  normal sphincter  tone without palpated masses or tenderness             Hemoccult not indicated     Assessment/Plan:  30 y.o. female for annual exam will return to the office at the time of placement of the Mirena IUD in approximately 3 weeks from now. She may decide the ParaGard T380A IUD. I've given her literature information on both forms of contraception since she has difficulty the past taking oral contraceptive pills. The following labs will  be ordered: Comprehensive metabolic panel, fasting lipid profile, TSH, CBC, and urinalysis. Pap smear was done today.   Terrance Mass MD, 11:28 AM 11/14/2015

## 2015-11-14 NOTE — Patient Instructions (Signed)
Sensibilidad en las mamas (Breast Tenderness) La sensibilidad en las mamas es un problema frecuente en las mujeres de todas las edades. y puede causar molestias leves o dolor intenso. Sus causas son variadas. Su mdico determinar la causa probable de la sensibilidad Civil Service fast streamer examen de las [place], las preguntas [employer] sntomas y la indicacin de algunos estudios. Por lo general, la sensibilidad en las mamas no significa que tenga cncer de mama. INSTRUCCIONES PARA EL CUIDADO EN EL HOGAR  A menudo, la sensibilidad en las mamas puede tratarse en Engineer, mining. Puede intentar lo siguiente:  Probarse un nuevo sostn que le brinde ms sujecin, especialmente mientras hace actividad fsica.  Usar un sostn con mejor sujecin o uno deportivo mientras duerme cuando las mamas estn muy sensibles.  Si tiene una lesin mamaria, aplique hielo en la zona:  Ponga el hielo en una bolsa plstica.  Colquese una toalla entre la piel y la bolsa de hielo.  Deje el hielo durante 20 minutos y aplquelo 2 a 3 veces por da.  Si tiene las [employer] [place] debido a la Transport planner, intente lo siguiente:  Extrigase [employer] o con un sacaleche.  Aplquese una compresa tibia en las mamas para ayudar a Transport planner.  Tome analgsicos de venta libre si su mdico lo autoriza.  Tome otros medicamentos que su mdico le recete, entre ellos, antibiticos o anticonceptivos. A largo plazo, puede aliviar la sensibilidad en las mamas si hace lo siguiente:  Disminuye el consumo de cafena.  Disminuye la cantidad de grasa de la dieta. Lleva un registro de los das y las horas cuando tiene mayor sensibilidad en las [place]. Esto ser de ayuda para que usted y su mdico encuentren la causa de la sensibilidad y cmo Solicitor. Adems, aprenda cmo examinarse las mamas en casa. Esto la ayudar a palpar un crecimiento o un bulto fuera de lo normal que podra causar la sensibilidad. SOLICITE ATENCIN MDICA SI:    Cualquier zona de la mama est dura, enrojecida y caliente al tacto. Puede ser un signo de infeccin.  Hay secrecin de los pezones (y no est amamantando). En especial, vigile la secrecin de sangre o pus.  Tiene fiebre, adems de sensibilidad en las mamas.  Tiene un bulto nuevo o doloroso en la mama que no desaparece despus de la finalizacin del perodo menstrual.  Ha intentando controlar el dolor en casa, pero no desaparece.  El dolor de la mama es ms intenso o le dificulta hacer las cosas que hace habitualmente durante el da.   Esta informacin no tiene Marine scientist el consejo del mdico. Asegrese de hacerle al mdico cualquier pregunta que tenga.   Document Released: 12/30/2012 Elsevier Interactive Patient Education [employer].

## 2015-11-15 LAB — PAP IG W/ RFLX HPV ASCU

## 2015-11-15 LAB — URINALYSIS W MICROSCOPIC + REFLEX CULTURE
BILIRUBIN URINE: NEGATIVE
Bacteria, UA: NONE SEEN [HPF]
Casts: NONE SEEN [LPF]
Crystals: NONE SEEN [HPF]
Glucose, UA: NEGATIVE
KETONES UR: NEGATIVE
Leukocytes, UA: NEGATIVE
NITRITE: NEGATIVE
PH: 6.5 (ref 5.0–8.0)
SPECIFIC GRAVITY, URINE: 1.014 (ref 1.001–1.035)
Yeast: NONE SEEN [HPF]

## 2015-11-17 ENCOUNTER — Other Ambulatory Visit: Payer: Self-pay | Admitting: Anesthesiology

## 2015-11-17 LAB — URINE CULTURE

## 2015-11-17 MED ORDER — NITROFURANTOIN MONOHYD MACRO 100 MG PO CAPS: 100.0000 mg | ORAL_CAPSULE | Freq: Two times a day (BID) | ORAL | 0 refills | Status: AC

## 2015-11-30 ENCOUNTER — Encounter: Payer: Self-pay | Admitting: *Deleted

## 2015-11-30 NOTE — Progress Notes (Signed)
Spoke to [place] at [employer] and insertion covered 100%. Effective dat 03/26/15- current, ref #[id number]. KW CMA

## 2016-08-07 ENCOUNTER — Encounter: Payer: Self-pay | Admitting: Gynecology

## 2017-09-02 ENCOUNTER — Encounter (HOSPITAL_COMMUNITY): Payer: Self-pay | Admitting: Emergency Medicine

## 2017-09-02 ENCOUNTER — Ambulatory Visit (HOSPITAL_COMMUNITY)
Admission: EM | Admit: 2017-09-02 | Discharge: 2017-09-02 | Disposition: A | Discharge status: Home / Self Care | Payer: BLUE CROSS/BLUE SHIELD | Attending: Family Medicine | Admitting: Family Medicine

## 2017-09-02 ENCOUNTER — Other Ambulatory Visit: Payer: Self-pay

## 2017-09-02 DIAGNOSIS — F4322 Adjustment disorder with anxiety: Secondary | ICD-10-CM

## 2017-09-02 DIAGNOSIS — G44209 Tension-type headache, unspecified, not intractable: Secondary | ICD-10-CM

## 2017-09-02 MED ORDER — ALPRAZOLAM 0.5 MG PO TABS: 0.5000 mg | ORAL_TABLET | Freq: Three times a day (TID) | ORAL | 0 refills | Status: DC

## 2017-09-02 NOTE — ED Provider Notes (Signed)
[place]    CSN: [id number] Arrival date & time: 09/02/17  1739     History   Chief Complaint Chief Complaint  Patient presents with  . Headache    HPI Whitney Bennett is a 32 y.o. female.   HPI  This is a very pleasant 32 year old Hispanic woman who is here to be evaluated for headache.  She complains of headache that feels like it starts as a pressure behind her neck and then involves her whole head.  She also feels like there is pressure down on her head and shoulders and a heavy feeling.  At times she feels like her chest is heavy as well.  She is not sleeping well.  She is not eating regularly.  She is not getting any exercise.  Further questioning reveals that she is under a lot of stress at this time.  Her parents, after being married for over 68 years, have recently sided to separate and her father moved in with her.  He is unable to pay his bills or keep regular work.  This is causing moderate stress on her family.  In addition she and her husband are not getting along.  They have decided to separate.  They have not told anybody yet.  They have 2 young children in the home.  She discusses that she becomes tearful and quite anxious.  She is never been treated for anxiety or depression before.  She seems unaware that this tremendous stress is taking its toll on her body.  Past Medical History:  Diagnosis Date  . Anxiety     Patient Active Problem List   Diagnosis Date Noted  . Breast tenderness in female 11/14/2015  . MONILIAL VAGINITIS 09/20/2008  . VISUAL IMPAIRMENT 09/20/2008  . HEARING DEFICIT 09/20/2008  . ABSCESS, TOOTH 06/10/2008  . URINARY TRACT INFECTION, RECURRENT 06/10/2008  . OTH D/O MENSTRUATION&OTH ABN BLEED FE GNT TRACT 06/10/2008    Past Surgical History:  Procedure Laterality Date  . NO PAST SURGERIES      OB History    Gravida  2   Para  1   Term  1   Preterm      AB  0   Living  2     SAB      TAB      Ectopic  0   Multiple      Live Births  1            Home Medications    Prior to Admission medications   Medication Sig Start Date End Date Taking? Authorizing Provider  acetaminophen (TYLENOL) 325 MG tablet Take 650 mg by mouth daily as needed for pain.    [provider]  ALPRAZolam Duanne Moron) 0.5 MG tablet Take 1 tablet (0.5 mg total) by mouth 3 (three) times daily. 09/02/17   Raylene Everts, MD  Multiple Vitamin (MULTIVITAMIN) tablet Take 1 tablet by mouth daily.    [provider]    Family History Family History  Problem Relation Age of Onset  . Diabetes Mother   . Thyroid disease Mother   . Hypercholesterolemia Father   . Depression Father   . Heart disease Brother   . Diabetes Maternal Grandmother   . Diabetes Maternal Grandfather   . Stroke Maternal Grandfather   . Diabetes Paternal Grandmother   . Alcohol abuse Paternal Grandfather   . Alcoholism Paternal Grandfather   . Other Neg Hx     Social History Social  History   Tobacco Use  . Smoking status: Never Smoker  . Smokeless tobacco: Never Used  Substance Use Topics  . Alcohol use: Yes    Alcohol/week: 0.5 - 1.0 oz    Types: 1 - 2 Standard drinks or equivalent per week    Comment: Quit Nov 2013 due to pregnancy  . Drug use: No     Allergies   Patient has no known allergies.   Review of Systems Review of Systems  Constitutional: Positive for activity change, appetite change, fatigue and unexpected weight change. Negative for chills and fever.  HENT: Negative for congestion, dental problem, ear pain and sore throat.   Eyes: Negative for pain and visual disturbance.  Respiratory: Positive for chest tightness. Negative for cough and shortness of breath.   Cardiovascular: Negative for chest pain and palpitations.  Gastrointestinal: Negative for abdominal pain and vomiting.  Genitourinary: Negative for dysuria and hematuria.  Musculoskeletal: Negative for arthralgias and back  pain.  Skin: Negative for color change and rash.  Neurological: Positive for headaches. Negative for seizures and syncope.  Psychiatric/Behavioral: Positive for decreased concentration, dysphoric mood and sleep disturbance. Negative for self-injury and suicidal ideas. The patient is nervous/anxious.   All other systems reviewed and are negative.    Physical Exam Triage Vital Signs ED Triage Vitals  Enc Vitals Group     BP 09/02/17 1915 (!) 135/96     Pulse Rate 09/02/17 1915 87     Resp 09/02/17 1915 20     Temp 09/02/17 1915 98 F (36.7 C)     Temp Source 09/02/17 1915 Oral     SpO2 09/02/17 1915 96 %     Weight --      Height --      Head Circumference --      Peak Flow --      Pain Score 09/02/17 1911 7     Pain Loc --      Pain Edu? --      Excl. in [place]? --    No data found.  Updated Vital Signs BP (!) 135/96 (BP Location: Left Arm)   Pulse 87   Temp 98 F (36.7 C) (Oral)   Resp 20   LMP 08/19/2017   SpO2 96%   Visual Acuity Right Eye Distance:   Left Eye Distance:   Bilateral Distance:    Right Eye Near:   Left Eye Near:    Bilateral Near:     Physical Exam  Constitutional: She is oriented to person, place, and time. She appears well-developed and well-nourished. No distress.  HENT:  Head: Normocephalic and atraumatic.  Mouth/Throat: Oropharynx is clear and moist.  Eyes: Pupils are equal, round, and reactive to light. Conjunctivae are normal. Right eye exhibits normal extraocular motion. Left eye exhibits normal extraocular motion.  Neck: Normal range of motion. Neck supple.  Cardiovascular: Normal rate, regular rhythm and normal heart sounds.  Pulmonary/Chest: Effort normal and breath sounds normal. No respiratory distress.  Abdominal: Soft. Bowel sounds are normal. She exhibits no distension. There is no tenderness.  Musculoskeletal: Normal range of motion. She exhibits no edema.  Lymphadenopathy:    She has no cervical adenopathy.  Neurological: She  is alert and oriented to person, place, and time. She has normal strength. Gait normal.  Skin: Skin is warm and dry.  Psychiatric: Her mood appears anxious.     UC Treatments / Results  Labs (all labs ordered are listed, but only abnormal results are displayed)  Labs Reviewed - No data to display  EKG None  Radiology No results found.  Procedures Procedures (including critical care time)  Medications Ordered in UC Medications - No data to display  Initial Impression / Assessment and Plan / UC Course  I have reviewed the triage vital signs and the nursing notes.  Pertinent labs & imaging results that were available during my care of the patient were reviewed by me and considered in my medical decision making (see chart for details).     Discussed with patient that her stress was causing her to be ill.  She has an adjustment disorder with anxious and depressed mood.  Discussed that she needs to be under the care of a counselor to help her through all of these dramatic changes in her life.  She may need medication.  She has a good friend who is a Education officer, museum who will help her find care.  We discussed some aspects of self-care.  She is not at risk for harming herself or others. Final Clinical Impressions(s) / UC Diagnoses   Final diagnoses:  Emotional tension headache  Adjustment disorder with anxious mood     Discharge Instructions     Try to eat well and take care of yourself Walk every day that you are able Take medicine if needed for sleep See a counselor to help with your journey   ED Prescriptions    Medication Sig Dispense Auth. Provider   ALPRAZolam Duanne Moron) 0.5 MG tablet Take 1 tablet (0.5 mg total) by mouth 3 (three) times daily. 10 tablet Raylene Everts, MD     Controlled Substance Prescriptions [place] Controlled Substance Registry consulted? No   Raylene Everts, MD 09/02/17 2119

## 2017-09-02 NOTE — ED Triage Notes (Signed)
A month history of headache, chest pain is sharp in central chest.  Pain is intermittent.  No relationship to foods.  Feels like lower jaw is cramping

## 2017-09-02 NOTE — Discharge Instructions (Signed)
Try to eat well and take care of yourself Walk every day that you are able Take medicine if needed for sleep See a counselor to help with your journey

## 2017-09-02 NOTE — ED Notes (Signed)
No plan to hurt herself, but not liking where she is in life right now

## 2017-12-08 ENCOUNTER — Encounter (HOSPITAL_COMMUNITY): Payer: Self-pay | Admitting: Emergency Medicine

## 2017-12-08 ENCOUNTER — Other Ambulatory Visit: Payer: Self-pay

## 2017-12-08 ENCOUNTER — Ambulatory Visit (HOSPITAL_COMMUNITY)
Admission: EM | Admit: 2017-12-08 | Discharge: 2017-12-08 | Disposition: A | Discharge status: Home / Self Care | Payer: Self-pay | Attending: Family Medicine | Admitting: Family Medicine

## 2017-12-08 DIAGNOSIS — H01004 Unspecified blepharitis left upper eyelid: Secondary | ICD-10-CM

## 2017-12-08 MED ORDER — ERYTHROMYCIN 5 MG/GM OP OINT: 1.0000 "application " | TOPICAL_OINTMENT | Freq: Four times a day (QID) | OPHTHALMIC | 0 refills | Status: AC

## 2017-12-08 MED ORDER — OLOPATADINE HCL 0.1 % OP SOLN: 1.0000 [drp] | Freq: Every day | OPHTHALMIC | 12 refills | Status: AC

## 2017-12-08 NOTE — ED Provider Notes (Signed)
[place]    CSN: [id number] Arrival date & time: 12/08/17  1914     History   Chief Complaint Chief Complaint  Patient presents with  . Eye Problem    HPI Whitney Bennett is a 32 y.o. female.   Cletis presents with complaints of left eye itching and swelling which has progressed over the past 3 days. Worse this morning. Had green drainage and mattering this morning. Has been using OTC drops and wipe which have only somewhat helped. No eye ball pain. Some light sensitivity. At times with drainage vision feels blurred but then with blinking or clearing resolves. No other vision change or loss. No exposure to eyes, contaminants or injury. No URI symptoms. No fevers. Denies any previous similar. Does not wear contacts or glasses. Without contributing medical history.     ROS per HPI.      Past Medical History:  Diagnosis Date  . Anxiety     Patient Active Problem List   Diagnosis Date Noted  . Breast tenderness in female 11/14/2015  . MONILIAL VAGINITIS 09/20/2008  . VISUAL IMPAIRMENT 09/20/2008  . HEARING DEFICIT 09/20/2008  . ABSCESS, TOOTH 06/10/2008  . URINARY TRACT INFECTION, RECURRENT 06/10/2008  . OTH D/O MENSTRUATION&OTH ABN BLEED FE GNT TRACT 06/10/2008    Past Surgical History:  Procedure Laterality Date  . NO PAST SURGERIES      OB History    Gravida  2   Para  1   Term  1   Preterm      AB  0   Living  2     SAB      TAB      Ectopic  0   Multiple      Live Births  1            Home Medications    Prior to Admission medications   Medication Sig Start Date End Date Taking? Authorizing Provider  Multiple Vitamin (MULTIVITAMIN) tablet Take 1 tablet by mouth daily.   Yes [provider]  acetaminophen (TYLENOL) 325 MG tablet Take 650 mg by mouth daily as needed for pain.    [provider]  ALPRAZolam Duanne Moron) 0.5 MG tablet Take 1 tablet (0.5 mg total) by mouth 3 (three) times daily.  09/02/17   Raylene Everts, MD  erythromycin ophthalmic ointment Place 1 application into the left eye 4 (four) times daily for 7 days. 12/08/17 12/15/17  Zigmund Gottron, NP  olopatadine (PATANOL) 0.1 % ophthalmic solution Place 1 drop into the left eye daily for 14 days. 12/08/17 12/22/17  Zigmund Gottron, NP    Family History Family History  Problem Relation Age of Onset  . Diabetes Mother   . Thyroid disease Mother   . Hypercholesterolemia Father   . Depression Father   . Heart disease Brother   . Diabetes Maternal Grandmother   . Diabetes Maternal Grandfather   . Stroke Maternal Grandfather   . Diabetes Paternal Grandmother   . Alcohol abuse Paternal Grandfather   . Alcoholism Paternal Grandfather   . Other Neg Hx     Social History Social History   Tobacco Use  . Smoking status: Never Smoker  . Smokeless tobacco: Never Used  Substance Use Topics  . Alcohol use: Yes    Alcohol/week: 1.0 - 2.0 standard drinks    Types: 1 - 2 Standard drinks or equivalent per week    Comment: Quit Nov 2013 due to pregnancy  .  Drug use: No     Allergies   Patient has no known allergies.   Review of Systems Review of Systems   Physical Exam Triage Vital Signs ED Triage Vitals  Enc Vitals Group     BP 12/08/17 1009 116/72     Pulse Rate 12/08/17 1009 68     Resp 12/08/17 1009 18     Temp 12/08/17 1009 98.5 F (36.9 C)     Temp Source 12/08/17 1009 Oral     SpO2 12/08/17 1009 98 %     Weight --      Height --      Head Circumference --      Peak Flow --      Pain Score 12/08/17 1007 6     Pain Loc --      Pain Edu? --      Excl. in [place]? --    No data found.  Updated Vital Signs BP 116/72 (BP Location: Left Arm)   Pulse 68   Temp 98.5 F (36.9 C) (Oral)   Resp 18   LMP 11/05/2017   SpO2 98%    Physical Exam  Constitutional: She is oriented to person, place, and time. She appears well-developed and well-nourished. No distress.  HENT:  Head: Normocephalic and  atraumatic.  Eyes: Pupils are equal, round, and reactive to light. Conjunctivae and EOM are normal. Left eye exhibits no discharge and no exudate. No foreign body present in the left eye.  Left lid with swelling and mild redness which appears to originate at lid line and extend up; mild lower lid swelling noted as well but much less severe; no drainage; no pain with eye movement; no redness to conjunctiva  Cardiovascular: Normal rate, regular rhythm and normal heart sounds.  Pulmonary/Chest: Effort normal and breath sounds normal.  Neurological: She is alert and oriented to person, place, and time.  Skin: Skin is warm and dry.     UC Treatments / Results  Labs (all labs ordered are listed, but only abnormal results are displayed) Labs Reviewed - No data to display  EKG None  Radiology No results found.  Procedures Procedures (including critical care time)  Medications Ordered in UC Medications - No data to display  Initial Impression / Assessment and Plan / UC Course  I have reviewed the triage vital signs and the nursing notes.  Pertinent labs & imaging results that were available during my care of the patient were reviewed by me and considered in my medical decision making (see chart for details).     Concern for blepharitis to left eye. Has been worsening and complains of drainage, opted to cover also with erythromycin ointment at this time. patanol drops twice a day to help with itching. Encouraged lash cleansing and warm compresses. Return precautions provided. Patient verbalized understanding and agreeable to plan.   Final Clinical Impressions(s) / UC Diagnoses   Final diagnoses:  Blepharitis of left upper eyelid, unspecified type     Discharge Instructions     Warm compresses to the eye 3-4 times a day as able to help with swelling.  Wash eye lids wish soap and wash cloth daily.  Use of eye ointment as prescribed as well as drops for itching twice a day.  If any  worsening of symptoms, pain, redness, vision loss, or otherwise worsening please return or follow up with eye doctor.    ED Prescriptions    Medication Sig Dispense Auth. Provider   erythromycin ophthalmic  ointment Place 1 application into the left eye 4 (four) times daily for 7 days. 28 g Augusto Gamble B, NP   olopatadine (PATANOL) 0.1 % ophthalmic solution Place 1 drop into the left eye daily for 14 days. 5 mL Augusto Gamble B, NP     Controlled Substance Prescriptions [place] Controlled Substance Registry consulted? Not Applicable   Zigmund Gottron, NP 12/08/17 1027

## 2017-12-08 NOTE — Discharge Instructions (Signed)
Warm compresses to the eye 3-4 times a day as able to help with swelling.  Wash eye lids wish soap and wash cloth daily.  Use of eye ointment as prescribed as well as drops for itching twice a day.  If any worsening of symptoms, pain, redness, vision loss, or otherwise worsening please return or follow up with eye doctor.

## 2017-12-08 NOTE — ED Triage Notes (Signed)
Left eye lid swelling and itching and drainage for 3 days

## 2018-01-01 ENCOUNTER — Encounter: Payer: Self-pay | Admitting: Internal Medicine

## 2018-01-02 ENCOUNTER — Encounter: Payer: Self-pay | Admitting: Internal Medicine

## 2018-01-02 ENCOUNTER — Ambulatory Visit: Payer: Self-pay | Admitting: Internal Medicine

## 2018-01-02 VITALS — BP 118/82 | HR 74 | Resp 12 | Ht 66.0 in | Wt 229.0 lb

## 2018-01-02 DIAGNOSIS — Z6836 Body mass index (BMI) 36.0-36.9, adult: Secondary | ICD-10-CM

## 2018-01-02 DIAGNOSIS — H00014 Hordeolum externum left upper eyelid: Secondary | ICD-10-CM

## 2018-01-02 DIAGNOSIS — L83 Acanthosis nigricans: Secondary | ICD-10-CM

## 2018-01-02 DIAGNOSIS — F419 Anxiety disorder, unspecified: Secondary | ICD-10-CM

## 2018-01-02 DIAGNOSIS — E669 Obesity, unspecified: Secondary | ICD-10-CM

## 2018-01-02 NOTE — Progress Notes (Signed)
LCSW completed new patient screening with patient in order to assess for mental health concerns and/or problems with social determinants of health (food, housing, transportation, interpersonal violence). Patient shared that she struggles with anxiety and stress, to the point where she went to the hospital about two months ago for acute anxiety. She shared about the ways that she is learning to cope with it, primarily through her support system. She expressed interest in counseling and made appointment for 10/25.   Patient reported no issues with any SDOH.

## 2018-01-02 NOTE — Patient Instructions (Addendum)
Drink a glass of water before every meal Drink 6-8 glasses of water daily Eat three meals daily Eat a protein and healthy fat with every meal (eggs,fish, chicken, [place] and limit red meats) Eat 5 servings of vegetables daily, mix the colors 1/2 cup is a serving) Eat 2 servings of fruit daily with skin, if skin is edible Use smaller plates Put food/utensils down as you chew and swallow each bite Eat at a table with friends/family at least once daily, no TV Do not eat in front of the TV  Recent studies show that people who consume all of their calories in a 12 hour period lose weight more efficiently.  For example, if you eat your first meal at 7:00 a.m., your last meal of the day should be completed by 7:00 p.m.  Warm pack eyelid for 20 minutes 2 to 3 times daily.

## 2018-01-02 NOTE — Progress Notes (Signed)
Subjective:    Patient ID: Whitney Bennett, female    DOB: Jul 01, 1985, 32 y.o.   MRN: [id number]  HPI   Here to establish  1.  Needs to set up physical--no need for pap until 10/2018.    2.  Hordeolum, left upper eyelid:  Was seen 3 weeks ago and started on Erythromycin eye ointment and eyelid scrub.  3.  Darkened skin lesions under left breast for 6 months--seems to be worsening.  Seems to be since more physically active.    4.  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   Started a weight loss clinic this week.  "Let's Get Thin"    8:30 a.m.:  Protein shake.   9:  Half piece of fruit 12:30 Lunch:  30 g protein and a vegetable.  3:30 - 4:  Protein bar snack 6:  30 g protein and boiled veggies.   Drinks water and unsweetened green tea.    Has been to a licensed Nutritionist in February.    5.  Anxiety:  Thinks this is work related.  She is working on lifestyle changes with this and feels she has a good hold on this.   Current Meds  Medication Sig  . acetaminophen (TYLENOL) 325 MG tablet Take 650 mg by mouth daily as needed for pain.  Marland Kitchen Chorionic Gonadotropin (HCG IJ) Inject as directed as directed.  . Prenatal MV-Min-Fe Fum-FA-DHA (PRENATAL 1 PO) Take by mouth daily.    No Known Allergies   Past Medical History:  Diagnosis Date  . Anxiety     Past Surgical History:  Procedure Laterality Date  . NO PAST SURGERIES     Family History  Problem Relation Age of Onset  . Diabetes Mother   . Thyroid disease Mother   . Hypercholesterolemia Father   . Depression Father   . Heart disease Brother   . Diabetes Maternal Grandmother   . Diabetes Maternal Grandfather   . Stroke Maternal Grandfather   . Diabetes Paternal Grandmother   . Alcohol abuse Paternal Grandfather   . Alcoholism Paternal Grandfather   . Other Neg Hx    Social History   Socioeconomic History  . Marital status: Married    Spouse name: Not on file  . Number  of children: Not on file  . Years of education: Not on file  . Highest education level: Not on file  Occupational History  . Occupation: Heritage manager  Social Needs  . Financial resource strain: Not on file  . Food insecurity:    Worry: Never true    Inability: Never true  . Transportation needs:    Medical: No    Non-medical: No  Tobacco Use  . Smoking status: Never Smoker  . Smokeless tobacco: Never Used  Substance and Sexual Activity  . Alcohol use: Yes    Alcohol/week: 1.0 - 2.0 standard drinks    Types: 1 - 2 Standard drinks or equivalent per week    Comment: Quit Nov 2013 due to pregnancy  . Drug use: No  . Sexual activity: Yes    Partners: Male    Birth control/protection: Condom  Lifestyle  . Physical activity:    Days per week: Not on file    Minutes per session: Not on file  . Stress: Rather much  Relationships  . Social connections:    Talks on phone: Not on file    Gets together: Not on file  Attends religious service: Not on file    Active member of club or organization: Not on file    Attends meetings of clubs or organizations: Not on file    Relationship status: Not on file  . Intimate partner violence:    Fear of current or ex partner: Not on file    Emotionally abused: No    Physically abused: No    Forced sexual activity: Not on file  Other Topics Concern  . Not on file  Social History Narrative  . Not on file      Review of Systems     Objective:   Physical Exam NAD HEENT:  PERRL, EOMI, discs sharp, left upper eyelid with hordeolum at eyelash margin.  TMs pearly gray, throat without injection.   Neck:  Supple, No adenopathy, no thyromegaly Chest:  CTA CV:  RRR with normal S1 and S2, No S3, S4 or murmur.  Radial and DP pulses normal and equal Abd:  S, NT, No HSM or mass, + BS LE:  No edema. Skin:  Darkening with thickened skin folds, posterior neck and under breasts.       Assessment & Plan:  1.  Obesity with what appears to be  mild acanthosis nigricans:  To return for fasting labs including glucose and lipids in the next 2 weeks. Discussed lifestyle changes at length. Concerned her anxiety is making this worse, but she feels she is making changes to decrease anxiety and not interested in medication at this time.    2.  Anxiety:  As above.  Encouraged counseling.  Shared her anxiety with LCSW as well and made F/U appt.  3.  Hordeolum vs Chalazion:  Warm pack twice daily.  To call if does not resolve and can refer to eye specialist.  4.  HM:  Encouraged flu vaccination.  Not interested in Rx for free vaccine at [facility].  Spent 45 minutes face to face

## 2018-01-16 ENCOUNTER — Other Ambulatory Visit: Payer: Self-pay | Admitting: Licensed Clinical Social Worker

## 2018-01-20 ENCOUNTER — Other Ambulatory Visit: Payer: Self-pay

## 2018-01-20 DIAGNOSIS — Z13220 Encounter for screening for lipoid disorders: Secondary | ICD-10-CM

## 2018-01-20 DIAGNOSIS — Z79899 Other long term (current) drug therapy: Secondary | ICD-10-CM

## 2018-01-21 LAB — COMPREHENSIVE METABOLIC PANEL
ALBUMIN: 3.7 g/dL (ref 3.5–5.5)
ALT: 19 IU/L (ref 0–32)
AST: 19 IU/L (ref 0–40)
Albumin/Globulin Ratio: 1.5 (ref 1.2–2.2)
Alkaline Phosphatase: 93 IU/L (ref 39–117)
BUN / CREAT RATIO: 9 (ref 9–23)
BUN: 15 mg/dL (ref 6–20)
Bilirubin Total: 0.4 mg/dL (ref 0.0–1.2)
CALCIUM: 8.5 mg/dL — AB (ref 8.7–10.2)
CO2: 18 mmol/L — ABNORMAL LOW (ref 20–29)
Chloride: 110 mmol/L — ABNORMAL HIGH (ref 96–106)
Creatinine, Ser: 1.69 mg/dL — ABNORMAL HIGH (ref 0.57–1.00)
GFR calc non Af Amer: 40 mL/min/{1.73_m2} — ABNORMAL LOW (ref >59)
GFR, EST AFRICAN AMERICAN: 46 mL/min/{1.73_m2} — AB (ref >59)
GLUCOSE: 92 mg/dL (ref 65–99)
Globulin, Total: 2.5 g/dL (ref 1.5–4.5)
Potassium: 4.2 mmol/L (ref 3.5–5.2)
Sodium: 143 mmol/L (ref 134–144)
TOTAL PROTEIN: 6.2 g/dL (ref 6.0–8.5)

## 2018-01-21 LAB — CBC WITH DIFFERENTIAL/PLATELET
BASOS ABS: 0.1 10*3/uL (ref 0.0–0.2)
Basos: 1 %
EOS (ABSOLUTE): 0.1 10*3/uL (ref 0.0–0.4)
Eos: 2 %
HEMOGLOBIN: 12.3 g/dL (ref 11.1–15.9)
Hematocrit: 36.8 % (ref 34.0–46.6)
IMMATURE GRANS (ABS): 0 10*3/uL (ref 0.0–0.1)
Immature Granulocytes: 0 %
LYMPHS: 34 %
Lymphocytes Absolute: 2.5 10*3/uL (ref 0.7–3.1)
MCH: 28.3 pg (ref 26.6–33.0)
MCHC: 33.4 g/dL (ref 31.5–35.7)
MCV: 85 fL (ref 79–97)
MONOCYTES: 5 %
Monocytes Absolute: 0.3 10*3/uL (ref 0.1–0.9)
NEUTROS ABS: 4.2 10*3/uL (ref 1.4–7.0)
NEUTROS PCT: 58 %
PLATELETS: 239 10*3/uL (ref 150–450)
RBC: 4.35 x10E6/uL (ref 3.77–5.28)
RDW: 13.6 % (ref 12.3–15.4)
WBC: 7.3 10*3/uL (ref 3.4–10.8)

## 2018-01-21 LAB — LIPID PANEL W/O CHOL/HDL RATIO
Cholesterol, Total: 226 mg/dL — ABNORMAL HIGH (ref 100–199)
HDL: 51 mg/dL (ref >39)
LDL Calculated: 137 mg/dL — ABNORMAL HIGH (ref 0–99)
Triglycerides: 190 mg/dL — ABNORMAL HIGH (ref 0–149)
VLDL Cholesterol Cal: 38 mg/dL (ref 5–40)

## 2018-02-06 ENCOUNTER — Other Ambulatory Visit: Payer: Self-pay

## 2018-02-06 DIAGNOSIS — N390 Urinary tract infection, site not specified: Secondary | ICD-10-CM

## 2018-02-06 DIAGNOSIS — R319 Hematuria, unspecified: Secondary | ICD-10-CM

## 2018-02-06 DIAGNOSIS — R7989 Other specified abnormal findings of blood chemistry: Secondary | ICD-10-CM

## 2018-02-06 LAB — POCT URINALYSIS DIPSTICK
Bilirubin, UA: NEGATIVE
Glucose, UA: NEGATIVE
Ketones, UA: NEGATIVE
NITRITE UA: NEGATIVE
PROTEIN UA: POSITIVE — AB
SPEC GRAV UA: 1.01 (ref 1.010–1.025)
Urobilinogen, UA: 0.2 E.U./dL
pH, UA: 6.5 (ref 5.0–8.0)

## 2018-02-07 LAB — BASIC METABOLIC PANEL
BUN/Creatinine Ratio: 10 (ref 9–23)
BUN: 17 mg/dL (ref 6–20)
CALCIUM: 8.9 mg/dL (ref 8.7–10.2)
CO2: 19 mmol/L — AB (ref 20–29)
CREATININE: 1.77 mg/dL — AB (ref 0.57–1.00)
Chloride: 103 mmol/L (ref 96–106)
GFR calc Af Amer: 43 mL/min/{1.73_m2} — ABNORMAL LOW (ref >59)
GFR calc non Af Amer: 38 mL/min/{1.73_m2} — ABNORMAL LOW (ref >59)
Glucose: 86 mg/dL (ref 65–99)
Potassium: 4.2 mmol/L (ref 3.5–5.2)
Sodium: 139 mmol/L (ref 134–144)

## 2018-02-08 LAB — URINE CULTURE

## 2018-02-25 ENCOUNTER — Encounter: Payer: Self-pay | Admitting: Internal Medicine

## 2018-02-25 ENCOUNTER — Telehealth: Payer: Self-pay | Admitting: Internal Medicine

## 2018-02-25 DIAGNOSIS — R809 Proteinuria, unspecified: Secondary | ICD-10-CM

## 2018-02-25 DIAGNOSIS — R7989 Other specified abnormal findings of blood chemistry: Secondary | ICD-10-CM

## 2018-02-25 DIAGNOSIS — R3121 Asymptomatic microscopic hematuria: Secondary | ICD-10-CM

## 2018-02-25 NOTE — Telephone Encounter (Signed)
Spoke with patient about elevated Creatinine with blood and protein in urine. No family history of autoimmune disease, including SLE. She does have a history of renal injury from high dosing of what sounds like an oral antibiotic when she was quite young.  Does not know name of the antibiotic she took.  States she has always been careful with her kidney health in the past with this history.   Hospitalized for 1 month then Hep B testing when pregnant apparently negative.  She believes she was immunized. No history of Hep C  Whitney Bennett:  Please check pricing for the following:    Will need serum C3 and C4 ANCA Anti GBM ANA--titre and fluorescence if positive Anti DS DNA Hep C HIV Serum immunoelectophoresis Repeat BMP 24 hour urine collection for protein.

## 2018-02-25 NOTE — Telephone Encounter (Signed)
Spoke with [facility] and the account priced for each test as follows:  HIV - 157.50 Hep C - 9.50 Serum Immuno Electrophoresis - 280.25 24 hour Urine Protein - 29.25 BMP-  4.20 C3 - 66.50 C4 - 66.50 Anti-GBM - 160.25 ANA with florescence - 83.00 Anti- DS DNA - 12.70 ANCA - 477.25

## 2018-02-26 NOTE — Telephone Encounter (Signed)
Please confirm the HIV is more like $20  Would like to have her come in for the following:    HIV Hep C BMP C3 C4 ANA Anti DS DNA  Adding this all up is $262.40,  There are more labs I would like to do, but they would add about$1,000 more.  I think we should do this piecemeal.  Anti GBM, serum immunoelectrophoresis , ANCA can all be done if unable to find a cause.  Also, please find out how much a Urine Protein to Creatinine level would cost and how much urine they need for this (she would need to obtain first morning void after 24 hours of no exercise for this and get it to the lab quickly so it can be checked within 24 hours.  Also, I did not find anything about HCG causing renal issues.  Let her know what would like to do and see if we can get her in quickly Thanks

## 2018-02-27 ENCOUNTER — Other Ambulatory Visit: Payer: Self-pay

## 2018-02-27 DIAGNOSIS — R7989 Other specified abnormal findings of blood chemistry: Secondary | ICD-10-CM

## 2018-02-27 DIAGNOSIS — R319 Hematuria, unspecified: Secondary | ICD-10-CM

## 2018-02-27 LAB — POCT URINALYSIS DIPSTICK
Bilirubin, UA: NEGATIVE
Glucose, UA: NEGATIVE
KETONES UA: NEGATIVE
NITRITE UA: NEGATIVE
Protein, UA: POSITIVE — AB
Spec Grav, UA: 1.015 (ref 1.010–1.025)
Urobilinogen, UA: 0.2 E.U./dL
pH, UA: 6.5 (ref 5.0–8.0)

## 2018-02-27 NOTE — Telephone Encounter (Signed)
Spoke with [facility] and Urine protein and creatinine will cost $ 65.25. Patient will need to collect first urine of the day. Patient is aware of cost and patient coming in today for labs and will pick up urine cup for collection and bring back on monday

## 2018-03-01 LAB — URINE CULTURE

## 2018-03-02 ENCOUNTER — Other Ambulatory Visit: Payer: Self-pay

## 2018-03-02 DIAGNOSIS — R319 Hematuria, unspecified: Secondary | ICD-10-CM

## 2018-03-02 DIAGNOSIS — R7989 Other specified abnormal findings of blood chemistry: Secondary | ICD-10-CM

## 2018-03-03 LAB — BASIC METABOLIC PANEL
BUN/Creatinine Ratio: 10 (ref 9–23)
BUN: 17 mg/dL (ref 6–20)
CHLORIDE: 104 mmol/L (ref 96–106)
CO2: 21 mmol/L (ref 20–29)
Calcium: 8.8 mg/dL (ref 8.7–10.2)
Creatinine, Ser: 1.64 mg/dL — ABNORMAL HIGH (ref 0.57–1.00)
GFR calc non Af Amer: 41 mL/min/{1.73_m2} — ABNORMAL LOW (ref >59)
GFR, EST AFRICAN AMERICAN: 48 mL/min/{1.73_m2} — AB (ref >59)
Glucose: 84 mg/dL (ref 65–99)
POTASSIUM: 4.4 mmol/L (ref 3.5–5.2)
SODIUM: 139 mmol/L (ref 134–144)

## 2018-03-03 LAB — C3 COMPLEMENT: Complement C3, Serum: 175 mg/dL — ABNORMAL HIGH (ref 82–167)

## 2018-03-03 LAB — HEPATITIS C ANTIBODY: Hep C Virus Ab: <0.1 s/co ratio (ref 0.0–0.9)

## 2018-03-03 LAB — PROTEIN / CREATININE RATIO, URINE
Creatinine, Urine: 44.9 mg/dL
Protein, Ur: 146.9 mg/dL
Protein/Creat Ratio: 3272 mg/g creat — ABNORMAL HIGH (ref 0–200)

## 2018-03-03 LAB — C4 COMPLEMENT: Complement C4, Serum: 34 mg/dL (ref 14–44)

## 2018-03-03 LAB — ANTINUCLEAR ANTIBODIES, IFA: ANA Titer 1: NEGATIVE

## 2018-03-03 LAB — HIV ANTIBODY (ROUTINE TESTING W REFLEX): HIV SCREEN 4TH GENERATION: NONREACTIVE

## 2018-03-03 LAB — ANTI-DNA ANTIBODY, DOUBLE-STRANDED: DSDNA AB: 1 [IU]/mL (ref 0–9)

## 2018-03-04 NOTE — Addendum Note (Signed)
Addended by: Marcelino Duster on: 03/04/2018 07:28 PM   Modules accepted: Orders

## 2018-03-05 ENCOUNTER — Telehealth: Payer: Self-pay | Admitting: Internal Medicine

## 2018-03-05 NOTE — Telephone Encounter (Signed)
See next note

## 2018-03-05 NOTE — Telephone Encounter (Signed)
Spoke with Dr. Joelyn Oms They will expedite patient's referral appt Gave labs and history.   Basically asymptomatic hematuria and almost nephrotic level proteinuria To send labs and record as well as letter and they will get her in. Has been on Phentermine and HCG for weight loss

## 2018-03-05 NOTE — Telephone Encounter (Signed)
Referral, labs and OV notes faxed to [facility]. Spoke with receptionist . States they will contact patient to get her scheduled and will try to get her in next week

## 2018-03-16 ENCOUNTER — Other Ambulatory Visit: Payer: Self-pay | Admitting: Nephrology

## 2018-03-16 DIAGNOSIS — N183 Chronic kidney disease, stage 3 unspecified: Secondary | ICD-10-CM

## 2018-03-23 ENCOUNTER — Ambulatory Visit
Admission: RE | Admit: 2018-03-23 | Discharge: 2018-03-23 | Disposition: A | Discharge status: Home / Self Care | Payer: Self-pay | Source: Ambulatory Visit | Attending: Nephrology | Admitting: Nephrology

## 2018-03-23 DIAGNOSIS — N183 Chronic kidney disease, stage 3 unspecified: Secondary | ICD-10-CM

## 2018-04-08 ENCOUNTER — Encounter: Payer: Self-pay | Admitting: Internal Medicine

## 2018-05-20 ENCOUNTER — Ambulatory Visit: Payer: Self-pay | Admitting: Internal Medicine

## 2018-05-20 ENCOUNTER — Encounter: Payer: Self-pay | Admitting: Internal Medicine

## 2018-05-20 VITALS — BP 128/88 | HR 80 | Resp 12 | Ht 66.0 in | Wt 222.0 lb

## 2018-05-20 DIAGNOSIS — Z23 Encounter for immunization: Secondary | ICD-10-CM

## 2018-05-20 DIAGNOSIS — Z0000 Encounter for general adult medical examination without abnormal findings: Secondary | ICD-10-CM

## 2018-05-20 DIAGNOSIS — N898 Other specified noninflammatory disorders of vagina: Secondary | ICD-10-CM

## 2018-05-20 DIAGNOSIS — N183 Chronic kidney disease, stage 3 unspecified: Secondary | ICD-10-CM

## 2018-05-20 DIAGNOSIS — N184 Chronic kidney disease, stage 4 (severe): Secondary | ICD-10-CM | POA: Insufficient documentation

## 2018-05-20 DIAGNOSIS — Z124 Encounter for screening for malignant neoplasm of cervix: Secondary | ICD-10-CM

## 2018-05-20 DIAGNOSIS — N189 Chronic kidney disease, unspecified: Secondary | ICD-10-CM | POA: Insufficient documentation

## 2018-05-20 LAB — POCT WET PREP WITH KOH
KOH Prep POC: NEGATIVE
RBC WET PREP PER HPF POC: NEGATIVE
Trichomonas, UA: NEGATIVE
Yeast Wet Prep HPF POC: NEGATIVE

## 2018-05-20 NOTE — Progress Notes (Signed)
Subjective:    Patient ID: Whitney Bennett, female   DOB: [birth year], 33 y.o.   MRN: [id number]   HPI   CPE with pap  1.  Pap: Last in 2017.  Normal.   Always normal.  No family history of cervical cancer.  Mother with cervical polyps.  2.  Mammogram:  Never.  No family history of breast cancer.  3.  Osteoprevention:  Not much in way of dairy daily.  Does drink unsweetened almond milk and occasionally, yogurt.  Walks 3 times weekly for 3 miles.    4.  Guaiac Cards:  Never.  5.  Colonoscopy:  Never.  No family history of colon cancer.  6.  Immunizations:  Immunization History  Administered Date(s) Administered  . Td 09/20/2008    7.  Glucose/Cholesterol:  Blood glucose has been fine fasting.  Does have mixed hyperlipidemia, though also with significant proteinuria with what appears to be chronic kidney disease.  Recently started on Lisinopril.      Current Meds  Medication Sig  . acetaminophen (TYLENOL) 325 MG tablet Take 650 mg by mouth daily as needed for pain.  Marland Kitchen lisinopril (PRINIVIL,ZESTRIL) 5 MG tablet Take 5 mg by mouth at bedtime.   No Known Allergies   Past Medical History:  Diagnosis Date  . Anxiety   . Chronic kidney disease    with proteinuria    Past Surgical History:  Procedure Laterality Date  . NO PAST SURGERIES      Family History  Problem Relation Age of Onset  . Diabetes Mother   . Thyroid disease Mother   . Cervical polyp Mother   . Post-traumatic stress disorder Mother   . Hypercholesterolemia Father   . Depression Father   . Hypertension Father   . Heart disease Brother   . Diabetes Maternal Grandmother   . Diabetes Maternal Grandfather   . Stroke Maternal Grandfather   . Diabetes Paternal Grandmother   . Alcohol abuse Paternal Grandfather   . Alcoholism Paternal Grandfather   . Other Neg Hx     Social History   Socioeconomic History  . Marital status: Married    Spouse name: Not on file  . Number of children: 2    . Years of education: 85  . Highest education level: Not on file  Occupational History  . Occupation: Heritage manager    Comment: senior paralegal  Social Needs  . Financial resource strain: Not on file  . Food insecurity:    Worry: Never true    Inability: Never true  . Transportation needs:    Medical: No    Non-medical: No  Tobacco Use  . Smoking status: Never Smoker  . Smokeless tobacco: Never Used  Substance and Sexual Activity  . Alcohol use: Yes    Alcohol/week: 1.0 - 2.0 standard drinks    Types: 1 - 2 Standard drinks or equivalent per week    Comment: quit 2019.  Previously occasionally  . Drug use: No  . Sexual activity: Yes    Partners: Male    Birth control/protection: Condom  Lifestyle  . Physical activity:    Days per week: Not on file    Minutes per session: Not on file  . Stress: Rather much  Relationships  . Social connections:    Talks on phone: Not on file    Gets together: Not on file    Attends religious service: Not on file    Active member of club or organization:  Not on file    Attends meetings of clubs or organizations: Not on file    Relationship status: Not on file  . Intimate partner violence:    Fear of current or ex partner: Not on file    Emotionally abused: No    Physically abused: No    Forced sexual activity: Not on file  Other Topics Concern  . Not on file  Social History Narrative   Lives at home with husband and 2 daughters.  2 cats, a dog and her father.      Review of Systems  Constitutional: Positive for fatigue (but improving). Negative for appetite change.  HENT: Positive for dental problem (In process of getting dental issues cared for.  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 Negative for ear pain.   Eyes: Positive for visual disturbance (planning to go to optometrist at [facility].  Has glasses.).  Respiratory: Negative for cough and shortness of breath.    Cardiovascular: Positive for chest pain (Has had for 3 months across upper bilateral chest--soreness.). Negative for palpitations and leg swelling.  Gastrointestinal: Negative for abdominal pain, blood in stool (No melena), constipation and diarrhea.  Genitourinary: Negative for dysuria, menstrual problem and vaginal discharge.  Musculoskeletal: Negative for arthralgias.  Skin: Negative for rash.       Areas of hyperpigmentation in skin folds of neck/breasts.  She has been using unknown antifungal cream, which has decreased the appearance of the skin lesions.  Psychiatric/Behavioral: The patient is nervous/anxious (Improving.).       Objective:   BP 128/88 (BP Location: Left Arm, Patient Position: Sitting, Cuff Size: Normal)   Pulse 80   Resp 12   Ht 5\' 6"  (1.676 m)   Wt 222 lb (100.7 kg)   LMP 04/18/2018   BMI 35.83 kg/m   Physical Exam  Constitutional: She is oriented to person, place, and time. She appears well-developed and well-nourished.  HENT:  Head: Normocephalic and atraumatic.  Right Ear: Hearing, tympanic membrane, external ear and ear canal normal.  Left Ear: Hearing, tympanic membrane, external ear and ear canal normal.  Nose: Nose normal.  Mouth/Throat: Uvula is midline, oropharynx is clear and moist and mucous membranes are normal.  Eyes: Pupils are equal, round, and reactive to light. Conjunctivae and EOM are normal.  Discs sharp bilaterally  Neck: Normal range of motion and full passive range of motion without pain. Neck supple. No thyromegaly present.  Cardiovascular: Normal rate, regular rhythm, S1 normal and S2 normal. Exam reveals no S3, no S4 and no friction rub.  No murmur heard. Carotid, radial, femoral, DP and PT pulses normal and equal  Pulmonary/Chest: Effort normal and breath sounds normal. Right breast exhibits no inverted nipple, no mass, no nipple discharge and no skin change. Left breast exhibits no inverted nipple, no mass, no nipple discharge  and no skin change.  Abdominal: Soft. Bowel sounds are normal. She exhibits no mass. There is no hepatosplenomegaly. There is no abdominal tenderness. No hernia.  Genitourinary:    Genitourinary Comments: Normal external female genitalia.  Scant white frothy discharge.  No underlying erythema of vaginal or cervical mucosa.   No CMT No uterine or adnexal mass or tenderness.   Musculoskeletal: Normal range of motion.  Lymphadenopathy:       Head (right side): No submental and no submandibular adenopathy present.       Head (left side): No submental and no submandibular  adenopathy present.    She has no cervical adenopathy.    She has no axillary adenopathy.       Right: No inguinal and no supraclavicular adenopathy present.       Left: No inguinal and no supraclavicular adenopathy present.  Neurological: She is alert and oriented to person, place, and time. She has normal strength and normal reflexes. No cranial nerve deficit or sensory deficit. Coordination and gait normal.  Skin: Skin is warm. No rash noted.  Psychiatric: She has a normal mood and affect. Her speech is normal and behavior is normal. Judgment and thought content normal. Cognition and memory are normal.     Assessment & Plan   1.  CPE Pap Tdap.  2.  Vaginal discharge.  Likely mild BV. Send GC/chlamydia  3.  Mild hyperlipidemia:  Repeat in 3 months.  Does have kidney disease with nephrotic level proteinuria, increasing risk for elevated cholesterol.  4.  Kidney disease with proteinuria.  Followed by Ren[facility].  Lisinopril

## 2018-05-22 LAB — GC/CHLAMYDIA PROBE AMP
Chlamydia trachomatis, NAA: NEGATIVE
Neisseria gonorrhoeae by PCR: NEGATIVE

## 2018-05-27 LAB — CYTOLOGY - PAP

## 2018-08-21 ENCOUNTER — Other Ambulatory Visit: Payer: Self-pay

## 2018-08-21 DIAGNOSIS — Z13220 Encounter for screening for lipoid disorders: Secondary | ICD-10-CM

## 2018-08-22 LAB — LIPID PANEL W/O CHOL/HDL RATIO
Cholesterol, Total: 232 mg/dL — ABNORMAL HIGH (ref 100–199)
HDL: 51 mg/dL (ref >39)
LDL Calculated: 138 mg/dL — ABNORMAL HIGH (ref 0–99)
Triglycerides: 215 mg/dL — ABNORMAL HIGH (ref 0–149)
VLDL Cholesterol Cal: 43 mg/dL — ABNORMAL HIGH (ref 5–40)

## 2018-08-24 ENCOUNTER — Other Ambulatory Visit: Payer: Self-pay

## 2018-08-24 ENCOUNTER — Ambulatory Visit: Payer: Self-pay | Admitting: Internal Medicine

## 2018-08-24 ENCOUNTER — Encounter: Payer: Self-pay | Admitting: Internal Medicine

## 2018-08-24 VITALS — BP 112/82 | HR 84 | Resp 12 | Ht 66.0 in | Wt 226.0 lb

## 2018-08-24 DIAGNOSIS — F419 Anxiety disorder, unspecified: Secondary | ICD-10-CM

## 2018-08-24 DIAGNOSIS — F329 Major depressive disorder, single episode, unspecified: Secondary | ICD-10-CM

## 2018-08-24 DIAGNOSIS — E782 Mixed hyperlipidemia: Secondary | ICD-10-CM

## 2018-08-24 DIAGNOSIS — F32A Depression, unspecified: Secondary | ICD-10-CM | POA: Insufficient documentation

## 2018-08-24 DIAGNOSIS — R300 Dysuria: Secondary | ICD-10-CM

## 2018-08-24 DIAGNOSIS — R609 Edema, unspecified: Secondary | ICD-10-CM

## 2018-08-24 DIAGNOSIS — T148XXA Other injury of unspecified body region, initial encounter: Secondary | ICD-10-CM

## 2018-08-24 LAB — POCT URINALYSIS DIPSTICK
Bilirubin, UA: NEGATIVE
Glucose, UA: NEGATIVE
Ketones, UA: NEGATIVE
Nitrite, UA: NEGATIVE
Protein, UA: POSITIVE — AB
Spec Grav, UA: 1.01 (ref 1.010–1.025)
Urobilinogen, UA: 0.2 E.U./dL
pH, UA: 6.5 (ref 5.0–8.0)

## 2018-08-24 MED ORDER — SERTRALINE HCL 50 MG PO TABS: ORAL_TABLET | ORAL | 11 refills | Status: DC

## 2018-08-24 NOTE — Progress Notes (Signed)
Subjective:    Patient ID: Whitney Bennett, female   DOB: 05/05/1985, 33 y.o.   MRN: [id number]   HPI   Face, feet and hands are swollen when she awakens in the past 2 weeks.  The swelling seems to go down throughout the day.  The bedroom is cool.   Has changed her diet with the pandemic.  She has been eating more home cooked meals, but thinks they are using more salt than she would normally eat.   This past weekend, she started cooking more for herself and she feels the swelling went down faster.   She has not been going out as she is at increased risk with her renal disease.    Urinating fine and urine is clear.  At end of urination, she has a bilateral pelvic pain and maybe some burning.  Has noted more bruising on her legs and does not remember where she hit them.  Has one bruise on her right forearm today she does not know how she got.  Also with bilateral low back pain in past 2 weeks.  The discomfort is fairly chronic.  Did not notice this with walking recently, but after sits down, the pain returns.      Has been very stressed with work.  They were not letting her work from home.  Unable to keep up with the caseload from home, so she left her job. She is counseling with Whitney Bennett at [facility] for past 2 weeks.   Her daughter was recently in the hospital with an infected tooth that required surgery. Feels panicked at times when trying to sleep.  Has a dog she is using for emotional support.  Depression screen [facility] 2/9 08/24/2018 01/02/2018  Decreased Interest 2 0  Down, Depressed, Hopeless 3 0  PHQ - 2 Score 5 0  Altered sleeping 2 1  Tired, decreased energy 2 3  Change in appetite 3 0  Feeling bad or failure about yourself  2 0  Trouble concentrating 0 0  Moving slowly or fidgety/restless 0 0  Suicidal thoughts 2 0  PHQ-9 Score 16 4  Difficult doing work/chores Very difficult -   GAD 7 : Generalized Anxiety Score 08/24/2018  Nervous,  Anxious, on Edge 3  Control/stop worrying 2  Worry too much - different things 2  Trouble relaxing 3  Restless 0  Easily annoyed or irritable 3  Afraid - awful might happen 3  Total GAD 7 Score 16         Current Meds  Medication Sig  . acetaminophen (TYLENOL) 325 MG tablet Take 650 mg by mouth daily as needed for pain.  Marland Kitchen lisinopril (PRINIVIL,ZESTRIL) 5 MG tablet Take 5 mg by mouth at bedtime.   No Known Allergies   Review of Systems    Objective:   BP 112/82 (BP Location: Left Arm, Patient Position: Sitting, Cuff Size: Large)   Pulse 84   Resp 12   Ht 5\' 6"  (1.676 m)   Wt 226 lb (102.5 kg)   LMP 07/27/2018 (Approximate)   BMI 36.48 kg/m   Physical Exam  NAD, but sad and tearful intermittently Lungs:  CTA CV:  RRR with normal S1 and S2, No S3, S4 or murmur.  Radial and DP pulses normal and equal Abd:  S, NT save for mild suprapubic tenderness.  No flank tenderness. No HSM or mass, + BS LE:  No bruising currently.  Small dime sized bruise in  anterior right forearm. No periorbital, hand or foot edema currently.  Assessment & Plan   1.  Edema:  Checking kidney function to see if recent significant worsening of function:  CMP.   No findings of edema currently. Encouraged her to avoid the sodium and to get out and move.  2.  Bruising: minimal today.  Check CBC, PT/PTT  3.  Depression/anxiety:  Sertraline 25 mg for 7 days, then increase to 50 mg daily thereafter.  Virtual visit in 7 days. Continue with counseling. Release of information to be able to speak with Whitney Bennett, her counselor.  4.  Dysuria, suprapubic tenderness:  UA with possibly some abnormalities not related to proteinuria/CKD.  Sending culture.  5.  Hyperlipidemia:  Discussed a bit worse.  She states she has not been eating well and is not being physically active as well.  Will continue to work on this.

## 2018-08-24 NOTE — Patient Instructions (Signed)
[facility] [employer] [facility]--[employer]

## 2018-08-25 ENCOUNTER — Telehealth: Payer: Self-pay | Admitting: Internal Medicine

## 2018-08-25 LAB — CBC WITH DIFFERENTIAL/PLATELET
Basophils Absolute: 0.1 10*3/uL (ref 0.0–0.2)
Basos: 1 %
EOS (ABSOLUTE): 0.1 10*3/uL (ref 0.0–0.4)
Eos: 1 %
Hematocrit: 35.9 % (ref 34.0–46.6)
Hemoglobin: 12.1 g/dL (ref 11.1–15.9)
Immature Grans (Abs): 0 10*3/uL (ref 0.0–0.1)
Immature Granulocytes: 0 %
Lymphocytes Absolute: 3.6 10*3/uL — ABNORMAL HIGH (ref 0.7–3.1)
Lymphs: 37 %
MCH: 27.9 pg (ref 26.6–33.0)
MCHC: 33.7 g/dL (ref 31.5–35.7)
MCV: 83 fL (ref 79–97)
Monocytes Absolute: 0.5 10*3/uL (ref 0.1–0.9)
Monocytes: 5 %
Neutrophils Absolute: 5.4 10*3/uL (ref 1.4–7.0)
Neutrophils: 56 %
Platelets: 250 10*3/uL (ref 150–450)
RBC: 4.34 x10E6/uL (ref 3.77–5.28)
RDW: 13.8 % (ref 11.7–15.4)
WBC: 9.7 10*3/uL (ref 3.4–10.8)

## 2018-08-25 LAB — COMPREHENSIVE METABOLIC PANEL
ALT: 14 IU/L (ref 0–32)
AST: 14 IU/L (ref 0–40)
Albumin/Globulin Ratio: 1.7 (ref 1.2–2.2)
Albumin: 3.8 g/dL (ref 3.8–4.8)
Alkaline Phosphatase: 93 IU/L (ref 39–117)
BUN/Creatinine Ratio: 10 (ref 9–23)
BUN: 20 mg/dL (ref 6–20)
Bilirubin Total: 0.4 mg/dL (ref 0.0–1.2)
CO2: 16 mmol/L — ABNORMAL LOW (ref 20–29)
Calcium: 8.8 mg/dL (ref 8.7–10.2)
Chloride: 108 mmol/L — ABNORMAL HIGH (ref 96–106)
Creatinine, Ser: 1.93 mg/dL — ABNORMAL HIGH (ref 0.57–1.00)
GFR calc Af Amer: 39 mL/min/{1.73_m2} — ABNORMAL LOW (ref >59)
GFR calc non Af Amer: 34 mL/min/{1.73_m2} — ABNORMAL LOW (ref >59)
Globulin, Total: 2.3 g/dL (ref 1.5–4.5)
Glucose: 92 mg/dL (ref 65–99)
Potassium: 4.8 mmol/L (ref 3.5–5.2)
Sodium: 140 mmol/L (ref 134–144)
Total Protein: 6.1 g/dL (ref 6.0–8.5)

## 2018-08-25 LAB — PROTIME-INR
INR: 1 (ref 0.8–1.2)
Prothrombin Time: 10.3 s (ref 9.1–12.0)

## 2018-08-25 LAB — SPECIMEN STATUS REPORT

## 2018-08-25 LAB — APTT: aPTT: 31 s (ref 24–33)
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
## 2018-08-26 LAB — URINE CULTURE

## 2018-08-26 NOTE — Telephone Encounter (Signed)
Labs faxed to [facility]. To Dr. Amil Amen for [facility]

## 2018-08-28 NOTE — Telephone Encounter (Signed)
Please call to see if she got started on Sertraline so we can get her scheduled for followup

## 2018-08-28 NOTE — Telephone Encounter (Signed)
I did call patient this morning and left detailed voicemail asking to call back to clinic to  provide information regarding started on medication  for [place] to be able to scheduled a week f/u appointment.  Called back this afternoon and left another detailed vm for patient to return call.

## 2018-09-01 ENCOUNTER — Other Ambulatory Visit (HOSPITAL_COMMUNITY): Payer: Self-pay | Admitting: Nephrology

## 2018-09-01 DIAGNOSIS — R809 Proteinuria, unspecified: Secondary | ICD-10-CM

## 2018-09-01 NOTE — Telephone Encounter (Signed)
Let her know I really encourage her to get the Sertraline as I think it will allow her to handle things a bit more easily.

## 2018-09-01 NOTE — Telephone Encounter (Signed)
Returned.
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
## 2018-09-02 NOTE — Telephone Encounter (Signed)
Spoke with patient who verbally expressed understanding and stated will pick up medication on Friday this week.

## 2018-09-02 NOTE — Telephone Encounter (Signed)
Left detailed vm for patient to return call.

## 2018-09-28 ENCOUNTER — Other Ambulatory Visit: Payer: Self-pay | Admitting: Student

## 2018-09-29 ENCOUNTER — Encounter (HOSPITAL_COMMUNITY): Payer: Self-pay

## 2018-09-29 ENCOUNTER — Ambulatory Visit (HOSPITAL_COMMUNITY)
Admission: RE | Admit: 2018-09-29 | Discharge: 2018-09-29 | Disposition: A | Discharge status: Home / Self Care | Payer: Self-pay | Source: Ambulatory Visit | Attending: Nephrology | Admitting: Nephrology

## 2018-09-29 ENCOUNTER — Other Ambulatory Visit: Payer: Self-pay

## 2018-09-29 DIAGNOSIS — Z8349 Family history of other endocrine, nutritional and metabolic diseases: Secondary | ICD-10-CM | POA: Insufficient documentation

## 2018-09-29 DIAGNOSIS — R809 Proteinuria, unspecified: Secondary | ICD-10-CM

## 2018-09-29 DIAGNOSIS — N269 Renal sclerosis, unspecified: Secondary | ICD-10-CM | POA: Insufficient documentation

## 2018-09-29 DIAGNOSIS — Z79899 Other long term (current) drug therapy: Secondary | ICD-10-CM | POA: Insufficient documentation

## 2018-09-29 DIAGNOSIS — Z8249 Family history of ischemic heart disease and other diseases of the circulatory system: Secondary | ICD-10-CM | POA: Insufficient documentation

## 2018-09-29 DIAGNOSIS — N183 Chronic kidney disease, stage 3 (moderate): Secondary | ICD-10-CM | POA: Insufficient documentation

## 2018-09-29 DIAGNOSIS — Z833 Family history of diabetes mellitus: Secondary | ICD-10-CM | POA: Insufficient documentation

## 2018-09-29 LAB — CBC
HCT: 36.9 % (ref 36.0–46.0)
Hemoglobin: 12 g/dL (ref 12.0–15.0)
MCH: 27.7 pg (ref 26.0–34.0)
MCHC: 32.5 g/dL (ref 30.0–36.0)
MCV: 85.2 fL (ref 80.0–100.0)
Platelets: 245 10*3/uL (ref 150–400)
RBC: 4.33 MIL/uL (ref 3.87–5.11)
RDW: 13.5 % (ref 11.5–15.5)
WBC: 8 10*3/uL (ref 4.0–10.5)
nRBC: 0 % (ref 0.0–0.2)

## 2018-09-29 LAB — BASIC METABOLIC PANEL
Anion gap: 8 (ref 5–15)
BUN: 21 mg/dL — ABNORMAL HIGH (ref 6–20)
CO2: 18 mmol/L — ABNORMAL LOW (ref 22–32)
Calcium: 8.5 mg/dL — ABNORMAL LOW (ref 8.9–10.3)
Chloride: 112 mmol/L — ABNORMAL HIGH (ref 98–111)
Creatinine, Ser: 1.94 mg/dL — ABNORMAL HIGH (ref 0.44–1.00)
GFR calc Af Amer: 39 mL/min — ABNORMAL LOW (ref >60)
GFR calc non Af Amer: 33 mL/min — ABNORMAL LOW (ref >60)
Glucose, Bld: 98 mg/dL (ref 70–99)
Potassium: 4 mmol/L (ref 3.5–5.1)
Sodium: 138 mmol/L (ref 135–145)

## 2018-09-29 LAB — PROTIME-INR
INR: 1 (ref 0.8–1.2)
Prothrombin Time: 13.1 seconds (ref 11.4–15.2)

## 2018-09-29 LAB — PREGNANCY, URINE: Preg Test, Ur: NEGATIVE

## 2018-09-29 MED ORDER — FENTANYL CITRATE (PF) 100 MCG/2ML IJ SOLN
INTRAMUSCULAR | Status: AC
  Filled 2018-09-29: qty 2

## 2018-09-29 MED ORDER — FENTANYL CITRATE (PF) 100 MCG/2ML IJ SOLN
INTRAMUSCULAR | Status: AC | PRN
  Administered 2018-09-29: 25 ug via INTRAVENOUS
  Administered 2018-09-29: 50 ug via INTRAVENOUS
  Administered 2018-09-29: 25 ug via INTRAVENOUS

## 2018-09-29 MED ORDER — HYDROCODONE-ACETAMINOPHEN 5-325 MG PO TABS
1.0000 | ORAL_TABLET | ORAL | Status: DC | PRN
  Administered 2018-09-29: 10:00:00 1 via ORAL
  Filled 2018-09-29: qty 1

## 2018-09-29 MED ORDER — MIDAZOLAM HCL 2 MG/2ML IJ SOLN
INTRAMUSCULAR | Status: AC | PRN
  Administered 2018-09-29: 1 mg via INTRAVENOUS
  Administered 2018-09-29 (×2): 0.5 mg via INTRAVENOUS

## 2018-09-29 MED ORDER — LIDOCAINE HCL (PF) 1 % IJ SOLN
INTRAMUSCULAR | Status: AC
  Filled 2018-09-29: qty 30

## 2018-09-29 MED ORDER — MIDAZOLAM HCL 2 MG/2ML IJ SOLN
INTRAMUSCULAR | Status: AC
  Filled 2018-09-29: qty 2

## 2018-09-29 MED ORDER — SODIUM CHLORIDE 0.9 % IV SOLN: INTRAVENOUS | Status: DC

## 2018-09-29 MED ORDER — GELATIN ABSORBABLE 12-7 MM EX MISC
CUTANEOUS | Status: AC
  Filled 2018-09-29: qty 1

## 2018-09-29 NOTE — Discharge Instructions (Signed)
Percutaneous Kidney Biopsy, Care After °This sheet gives you information about how to care for yourself after your procedure. Your health care provider may also give you more specific instructions. If you have problems or questions, contact your health care provider. °What can I expect after the procedure? °After the procedure, it is common to have: °· Pain or soreness near the area where the needle went through your skin (biopsy site). °· Bright pink or cloudy urine for 24 hours after the procedure. °Follow these instructions at home: °Activity °· Return to your normal activities as told by your health care provider. Ask your health care provider what activities are safe for you. °· Do not drive for 24 hours if you were given a medicine to help you relax (sedative). °· Do not lift anything that is heavier than 10 lb (4.5 kg) until your health care provider tells you that it is safe. °· Avoid activities that take a lot of effort (are strenuous) until your health care provider approves. Most people will have to wait 2 weeks before returning to activities such as exercise or sexual intercourse. °General instructions ° °· Take over-the-counter and prescription medicines only as told by your health care provider. °· You may eat and drink after your procedure. Follow instructions from your health care provider about eating or drinking restrictions. °· Check your biopsy site every day for signs of infection. Check for: °? More redness, swelling, or pain. °? More fluid or blood. °? Warmth. °? Pus or a bad smell. °· Keep all follow-up visits as told by your health care provider. This is important. °Contact a health care provider if: °· You have more redness, swelling, or pain around your biopsy site. °· You have more fluid or blood coming from your biopsy site. °· Your biopsy site feels warm to the touch. °· You have pus or a bad smell coming from your biopsy site. °· You have blood in your urine more than 24 hours after  your procedure. °Get help right away if: °· You have dark red or brown urine. °· You have a fever. °· You are unable to urinate. °· You feel burning when you urinate. °· You feel faint. °· You have severe pain in your abdomen or side. °This information is not intended to replace advice given to you by your health care provider. Make sure you discuss any questions you have with your health care provider. °Document Released: 11/11/2012 Document Revised: 02/21/2017 Document Reviewed: 12/22/2015 °Elsevier Patient Education © 2020 [employer] ° °

## 2018-09-29 NOTE — Progress Notes (Signed)
   Left random renal biopsy in IR this am  Pt has done well in [place] Rested No complaints  May have seen a tinge of blood in urine-- but "having her period"  BP 109/66 P71 02 96% RA Denies pain  Site is clean and dry NT No bleeding no hematoma   May DC home now To call or return if notes blood in urine or pain

## 2018-09-29 NOTE — H&P (Signed)
Chief Complaint: Patient was seen in consultation today for random renal biopsy at the request of Sanford,Ryan B  Referring Physician(s): Sanford,Ryan B  Supervising Physician: Markus Daft  Patient Status: [facility] - Out-pt  History of Present Illness: Whitney Bennett is a 33 y.o. female   Known renal disease since Dec 2019 Follows with Dr Joelyn Oms Nephrotic range proteinuria  Scheduled for random renal bx  Past Medical History:  Diagnosis Date  . Anxiety   . Chronic kidney disease    with proteinuria    Past Surgical History:  Procedure Laterality Date  . NO PAST SURGERIES      Allergies: Patient has no known allergies.  Medications: Prior to Admission medications   Medication Sig Start Date End Date Taking? Authorizing Provider  acetaminophen (TYLENOL) 325 MG tablet Take 650 mg by mouth daily as needed for pain.   Yes [provider]  lisinopril (PRINIVIL,ZESTRIL) 5 MG tablet Take 5 mg by mouth at bedtime. 04/17/18  Yes [provider]  sertraline (ZOLOFT) 50 MG tablet 1/2 tab by mouth daily for 7 days, then 1 tab by mouth daily Patient not taking: Reported on 09/23/2018 08/24/18   Mack Hook, MD     Family History  Problem Relation Age of Onset  . Diabetes Mother   . Thyroid disease Mother   . Cervical polyp Mother   . Post-traumatic stress disorder Mother   . Hypercholesterolemia Father   . Depression Father   . Hypertension Father   . Heart disease Brother   . Diabetes Maternal Grandmother   . Diabetes Maternal Grandfather   . Stroke Maternal Grandfather   . Diabetes Paternal Grandmother   . Alcohol abuse Paternal Grandfather   . Alcoholism Paternal Grandfather   . Other Neg Hx     Social History   Socioeconomic History  . Marital status: Married    Spouse name: Not on file  . Number of children: 2  . Years of education: 24  . Highest education level: Not on file  Occupational History  . Occupation: Heritage manager     Comment: senior paralegal  Social Needs  . Financial resource strain: Not on file  . Food insecurity    Worry: Never true    Inability: Never true  . Transportation needs    Medical: No    Non-medical: No  Tobacco Use  . Smoking status: Never Smoker  . Smokeless tobacco: Never Used  Substance and Sexual Activity  . Alcohol use: Yes    Alcohol/week: 1.0 - 2.0 standard drinks    Types: 1 - 2 Standard drinks or equivalent per week    Comment: quit 2019.  Previously occasionally  . Drug use: No  . Sexual activity: Yes    Partners: Male    Birth control/protection: Condom  Lifestyle  . Physical activity    Days per week: Not on file    Minutes per session: Not on file  . Stress: Rather much  Relationships  . Social Herbalist on phone: Not on file    Gets together: Not on file    Attends religious service: Not on file    Active member of club or organization: Not on file    Attends meetings of clubs or organizations: Not on file    Relationship status: Not on file  Other Topics Concern  . Not on file  Social History Narrative   Lives at home with husband and 2 daughters.  2 cats,  a dog and her father.    Review of Systems: A 12 point ROS discussed and pertinent positives are indicated in the HPI above.  All other systems are negative.  Review of Systems  Constitutional: Negative for activity change, fatigue and fever.  Respiratory: Negative for shortness of breath.   Cardiovascular: Negative for chest pain.  Gastrointestinal: Negative for abdominal pain.  Neurological: Negative for weakness.  Psychiatric/Behavioral: Negative for behavioral problems and confusion.    Vital Signs: BP 127/85   Pulse 71   Temp 98.1 F (36.7 C) (Oral)   Ht 5\' 6"  (1.676 m)   Wt 226 lb (102.5 kg)   SpO2 100%   BMI 36.48 kg/m   Physical Exam Vitals signs reviewed.  Cardiovascular:     Rate and Rhythm: Normal rate and regular rhythm.     Heart sounds: Normal heart  sounds.  Pulmonary:     Effort: Pulmonary effort is normal.     Breath sounds: Normal breath sounds.  Abdominal:     Tenderness: There is no abdominal tenderness.  Musculoskeletal: Normal range of motion.     Right lower leg: No edema.     Left lower leg: No edema.  Skin:    General: Skin is warm and dry.  Neurological:     Mental Status: She is alert and oriented to person, place, and time.  Psychiatric:        Mood and Affect: Mood normal.        Behavior: Behavior normal.        Thought Content: Thought content normal.        Judgment: Judgment normal.     Imaging: No results found.  Labs:  CBC: Recent Labs    01/20/18 0945 08/24/18 1617  WBC 7.3 9.7  HGB 12.3 12.1  HCT 36.8 35.9  PLT 239 250    COAGS: Recent Labs    08/24/18 1617  INR 1.0  APTT 31    BMP: Recent Labs    01/20/18 0945 02/06/18 1526 02/27/18 1520 08/21/18 0905  NA 143 139 139 140  K 4.2 4.2 4.4 4.8  CL 110* 103 104 108*  CO2 18* 19* 21 16*  GLUCOSE 92 86 84 92  BUN 15 17 17 20   CALCIUM 8.5* 8.9 8.8 8.8  CREATININE 1.69* 1.77* 1.64* 1.93*  GFRNONAA 40* 38* 41* 34*  GFRAA 46* 43* 48* 39*    LIVER FUNCTION TESTS: Recent Labs    01/20/18 0945 08/21/18 0905  BILITOT 0.4 0.4  AST 19 14  ALT 19 14  ALKPHOS 93 93  PROT 6.2 6.1  ALBUMIN 3.7 3.8    TUMOR MARKERS: No results for input(s): AFPTM, CEA, CA199, CHROMGRNA in the last 8760 hours.  Assessment and Plan:  CKD 3 Nephrotic range proteinuria Scheduled for random renal bx Risks and benefits of random renal bx was discussed with the patient and/or patient's family including, but not limited to bleeding, infection, damage to adjacent structures or low yield requiring additional tests.  All of the questions were answered and there is agreement to proceed. Consent signed and in chart.   Thank you for this interesting consult.  I greatly enjoyed meeting Whitney Bennett and look forward to participating in their  care.  A copy of this report was sent to the requesting provider on this date.  Electronically Signed: Lavonia Drafts, PA-C 09/29/2018, 7:20 AM   I spent a total of  30 Minutes   in face to face  in clinical consultation, greater than 50% of which was counseling/coordinating care for random renal bx

## 2018-09-29 NOTE — Procedures (Signed)
Interventional Radiology Procedure:   Indications: Nephrotic range proteinuria  Procedure: US guided left renal biopsy  Findings: 2 cores from lower pole.  No bleeding or hematoma formation.  Complications: None     EBL: Less than 20 ml  Plan: Bedrest 4 hours, plan to discharge to home.     Roshana Shuffield R. Anselm Pancoast, MD  Pager: [phone]

## 2018-10-05 ENCOUNTER — Encounter: Payer: Self-pay | Admitting: Internal Medicine

## 2018-10-05 ENCOUNTER — Ambulatory Visit: Payer: Self-pay | Admitting: Internal Medicine

## 2018-10-05 ENCOUNTER — Other Ambulatory Visit: Payer: Self-pay

## 2018-10-05 VITALS — BP 126/80 | HR 76 | Resp 12 | Ht 66.0 in | Wt 224.0 lb

## 2018-10-05 DIAGNOSIS — F329 Major depressive disorder, single episode, unspecified: Secondary | ICD-10-CM

## 2018-10-05 DIAGNOSIS — F419 Anxiety disorder, unspecified: Secondary | ICD-10-CM

## 2018-10-05 DIAGNOSIS — R609 Edema, unspecified: Secondary | ICD-10-CM

## 2018-10-05 DIAGNOSIS — N183 Chronic kidney disease, stage 3 unspecified: Secondary | ICD-10-CM
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
## 2018-10-07 ENCOUNTER — Encounter (HOSPITAL_COMMUNITY): Payer: Self-pay

## 2018-10-26 ENCOUNTER — Ambulatory Visit (INDEPENDENT_AMBULATORY_CARE_PROVIDER_SITE_OTHER): Payer: Self-pay | Admitting: Licensed Clinical Social Worker

## 2018-10-26 ENCOUNTER — Other Ambulatory Visit: Payer: Self-pay

## 2018-10-26 DIAGNOSIS — F430 Acute stress reaction: Secondary | ICD-10-CM

## 2018-10-26 DIAGNOSIS — F439 Reaction to severe stress, unspecified: Secondary | ICD-10-CM

## 2018-10-26 DIAGNOSIS — F411 Generalized anxiety disorder: Secondary | ICD-10-CM

## 2018-10-26 DIAGNOSIS — F331 Major depressive disorder, recurrent, moderate: Secondary | ICD-10-CM
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
## 2018-11-02 ENCOUNTER — Other Ambulatory Visit: Payer: Self-pay | Admitting: Licensed Clinical Social Worker

## 2018-11-02 ENCOUNTER — Telehealth: Payer: Self-pay | Admitting: Licensed Clinical Social Worker

## 2018-11-02 NOTE — Telephone Encounter (Signed)
Patient sent email at 9:30 cancelling appointment due to car trouble.  Contacted patient and left message offering virtual visit and different time today.

## 2019-01-05 ENCOUNTER — Ambulatory Visit: Payer: Self-pay | Admitting: Internal Medicine

## 2019-02-15 ENCOUNTER — Ambulatory Visit: Payer: Self-pay | Admitting: Internal Medicine

## 2019-03-31 ENCOUNTER — Ambulatory Visit: Payer: Self-pay | Admitting: Internal Medicine

## 2019-03-31 ENCOUNTER — Encounter: Payer: Self-pay | Admitting: Internal Medicine

## 2019-03-31 ENCOUNTER — Other Ambulatory Visit: Payer: Self-pay

## 2019-03-31 VITALS — BP 122/82 | HR 76 | Resp 12 | Ht 66.0 in | Wt 229.0 lb

## 2019-03-31 DIAGNOSIS — N183 Chronic kidney disease, stage 3 unspecified: Secondary | ICD-10-CM

## 2019-03-31 DIAGNOSIS — F32A Depression, unspecified: Secondary | ICD-10-CM

## 2019-03-31 DIAGNOSIS — F419 Anxiety disorder, unspecified: Secondary | ICD-10-CM

## 2019-03-31 DIAGNOSIS — F329 Major depressive disorder, single episode, unspecified: Secondary | ICD-10-CM

## 2019-03-31 NOTE — Progress Notes (Signed)
    Subjective:    Patient ID: Whitney Bennett, female   DOB: 12/10/1985, 34 y.o.   MRN: [id number]   HPI   1.  Depression and Anxiety:  Discussed my concern regarding stress levels and cause of stress/anxiety/depression. She did get counseling for 1-2 sessions with [employer]. She is journaling, support group, trying to meditate. Feels the journaling is helping her process emotions. Sleeping better. She did sign up with [facility] for counseling. Will be speaking with one of our SW interns to start working with them. Was very sad around Christmas. Screams in the car outside the house to let frustrations out.  Has found this helps. Having difficult conversations with those around her.    2.  CKD:  Biopsy showed FSGN:  She has been told to really decrease her sugar intake and has tried--went vegan at one time, but may have gone too far. She is waiting to hear back from Nephrology about follow up--put off due to [place]   No outpatient medications have been marked as taking for the 03/31/19 encounter (Office Visit) with Mack Hook, MD.      Allergies  Allergen Reactions  . Lisinopril Cough     Review of Systems    Objective:   BP 122/82 (BP Location: Left Arm, Patient Position: Sitting, Cuff Size: Normal)   Pulse 76   Resp 12   Ht 5\' 6"  (1.676 m)   Wt 229 lb (103.9 kg)   LMP 03/04/2019   BMI 36.96 kg/m   Physical Exam  NAD Lungs:  CTA CV:  RRR without murmur or rub.  Radial and DP pulses normal and equal LE:  No edema   Assessment & Plan   1.  Depression/anxiety:  Encouraged her to continue counseling.  Pilar Plate discussion about not covering up how she feels.  At times, seems to be covering with a very upbeat outward appearance.  States she does feel she is doing better.  2.  CKD/FSGN:  As per Nephrology.  CPE in 3 months.

## 2019-04-14 ENCOUNTER — Other Ambulatory Visit: Payer: Self-pay | Admitting: Licensed Clinical Social Worker

## 2019-04-22 ENCOUNTER — Ambulatory Visit: Payer: Self-pay | Attending: Internal Medicine

## 2019-04-22 DIAGNOSIS — Z20822 Contact with and (suspected) exposure to covid-19: Secondary | ICD-10-CM | POA: Insufficient documentation

## 2019-04-23 LAB — NOVEL CORONAVIRUS, NAA: SARS-CoV-2, NAA: NOT DETECTED

## 2019-05-27 ENCOUNTER — Ambulatory Visit: Payer: BLUE CROSS/BLUE SHIELD | Attending: Internal Medicine

## 2019-05-27 DIAGNOSIS — Z20822 Contact with and (suspected) exposure to covid-19: Secondary | ICD-10-CM | POA: Insufficient documentation

## 2019-05-28 LAB — NOVEL CORONAVIRUS, NAA: SARS-CoV-2, NAA: NOT DETECTED

## 2019-06-30 ENCOUNTER — Encounter: Payer: Self-pay | Admitting: Internal Medicine

## 2019-12-23 ENCOUNTER — Other Ambulatory Visit: Payer: Self-pay

## 2019-12-23 DIAGNOSIS — Z20822 Contact with and (suspected) exposure to covid-19: Secondary | ICD-10-CM

## 2019-12-24 LAB — SARS-COV-2, NAA 2 DAY TAT

## 2019-12-24 LAB — NOVEL CORONAVIRUS, NAA: SARS-CoV-2, NAA: NOT DETECTED

## 2020-01-20 ENCOUNTER — Other Ambulatory Visit: Payer: Self-pay

## 2020-01-20 DIAGNOSIS — Z20822 Contact with and (suspected) exposure to covid-19: Secondary | ICD-10-CM

## 2020-01-21 LAB — NOVEL CORONAVIRUS, NAA: SARS-CoV-2, NAA: NOT DETECTED

## 2020-01-21 LAB — SARS-COV-2, NAA 2 DAY TAT

## 2020-02-17 ENCOUNTER — Inpatient Hospital Stay (HOSPITAL_COMMUNITY)
Admission: EM | Admit: 2020-02-17 | Discharge: 2020-02-19 | DRG: 682 | Disposition: A | Discharge status: Home / Self Care | Payer: BLUE CROSS/BLUE SHIELD | Attending: Family Medicine | Admitting: Family Medicine

## 2020-02-17 ENCOUNTER — Other Ambulatory Visit: Payer: Self-pay

## 2020-02-17 ENCOUNTER — Emergency Department (HOSPITAL_COMMUNITY): Payer: BLUE CROSS/BLUE SHIELD

## 2020-02-17 ENCOUNTER — Encounter (HOSPITAL_COMMUNITY): Payer: Self-pay

## 2020-02-17 DIAGNOSIS — R161 Splenomegaly, not elsewhere classified: Secondary | ICD-10-CM | POA: Diagnosis present

## 2020-02-17 DIAGNOSIS — Z83438 Family history of other disorder of lipoprotein metabolism and other lipidemia: Secondary | ICD-10-CM | POA: Diagnosis not present

## 2020-02-17 DIAGNOSIS — Z823 Family history of stroke: Secondary | ICD-10-CM | POA: Diagnosis not present

## 2020-02-17 DIAGNOSIS — E872 Acidosis: Secondary | ICD-10-CM | POA: Diagnosis present

## 2020-02-17 DIAGNOSIS — Z833 Family history of diabetes mellitus: Secondary | ICD-10-CM | POA: Diagnosis not present

## 2020-02-17 DIAGNOSIS — Z6836 Body mass index (BMI) 36.0-36.9, adult: Secondary | ICD-10-CM | POA: Diagnosis not present

## 2020-02-17 DIAGNOSIS — U071 COVID-19: Secondary | ICD-10-CM | POA: Diagnosis present

## 2020-02-17 DIAGNOSIS — N1832 Chronic kidney disease, stage 3b: Secondary | ICD-10-CM | POA: Diagnosis present

## 2020-02-17 DIAGNOSIS — N200 Calculus of kidney: Secondary | ICD-10-CM | POA: Diagnosis present

## 2020-02-17 DIAGNOSIS — D649 Anemia, unspecified: Secondary | ICD-10-CM | POA: Diagnosis present

## 2020-02-17 DIAGNOSIS — E669 Obesity, unspecified: Secondary | ICD-10-CM | POA: Diagnosis present

## 2020-02-17 DIAGNOSIS — F32A Depression, unspecified: Secondary | ICD-10-CM | POA: Diagnosis present

## 2020-02-17 DIAGNOSIS — E860 Dehydration: Secondary | ICD-10-CM | POA: Diagnosis present

## 2020-02-17 DIAGNOSIS — E782 Mixed hyperlipidemia: Secondary | ICD-10-CM | POA: Diagnosis present

## 2020-02-17 DIAGNOSIS — N179 Acute kidney failure, unspecified: Principal | ICD-10-CM | POA: Diagnosis present

## 2020-02-17 DIAGNOSIS — N031 Chronic nephritic syndrome with focal and segmental glomerular lesions: Secondary | ICD-10-CM | POA: Diagnosis present

## 2020-02-17 DIAGNOSIS — R509 Fever, unspecified: Secondary | ICD-10-CM

## 2020-02-17 DIAGNOSIS — Z8349 Family history of other endocrine, nutritional and metabolic diseases: Secondary | ICD-10-CM

## 2020-02-17 DIAGNOSIS — N12 Tubulo-interstitial nephritis, not specified as acute or chronic: Secondary | ICD-10-CM | POA: Diagnosis present

## 2020-02-17 DIAGNOSIS — Z8249 Family history of ischemic heart disease and other diseases of the circulatory system: Secondary | ICD-10-CM

## 2020-02-17 DIAGNOSIS — N10 Acute tubulo-interstitial nephritis: Secondary | ICD-10-CM | POA: Diagnosis not present

## 2020-02-17 DIAGNOSIS — F419 Anxiety disorder, unspecified: Secondary | ICD-10-CM | POA: Diagnosis present

## 2020-02-17 LAB — HEPATIC FUNCTION PANEL
ALT: 14 U/L (ref 0–44)
AST: 15 U/L (ref 15–41)
Albumin: 3.1 g/dL — ABNORMAL LOW (ref 3.5–5.0)
Alkaline Phosphatase: 79 U/L (ref 38–126)
Bilirubin, Direct: <0.1 mg/dL (ref 0.0–0.2)
Total Bilirubin: 0.5 mg/dL (ref 0.3–1.2)
Total Protein: 6.4 g/dL — ABNORMAL LOW (ref 6.5–8.1)

## 2020-02-17 LAB — URINALYSIS, MICROSCOPIC (REFLEX): RBC / HPF: >50 RBC/hpf (ref 0–5)

## 2020-02-17 LAB — URINALYSIS, ROUTINE W REFLEX MICROSCOPIC

## 2020-02-17 LAB — BASIC METABOLIC PANEL
Anion gap: 11 (ref 5–15)
BUN: 24 mg/dL — ABNORMAL HIGH (ref 6–20)
CO2: 16 mmol/L — ABNORMAL LOW (ref 22–32)
Calcium: 7.5 mg/dL — ABNORMAL LOW (ref 8.9–10.3)
Chloride: 107 mmol/L (ref 98–111)
Creatinine, Ser: 3.82 mg/dL — ABNORMAL HIGH (ref 0.44–1.00)
GFR, Estimated: 15 mL/min — ABNORMAL LOW (ref >60)
Glucose, Bld: 108 mg/dL — ABNORMAL HIGH (ref 70–99)
Potassium: 3.7 mmol/L (ref 3.5–5.1)
Sodium: 134 mmol/L — ABNORMAL LOW (ref 135–145)

## 2020-02-17 LAB — LACTIC ACID, PLASMA
Lactic Acid, Venous: 0.7 mmol/L (ref 0.5–1.9)
Lactic Acid, Venous: 1.1 mmol/L (ref 0.5–1.9)

## 2020-02-17 LAB — CBC
HCT: 35.2 % — ABNORMAL LOW (ref 36.0–46.0)
Hemoglobin: 11 g/dL — ABNORMAL LOW (ref 12.0–15.0)
MCH: 26.5 pg (ref 26.0–34.0)
MCHC: 31.3 g/dL (ref 30.0–36.0)
MCV: 84.8 fL (ref 80.0–100.0)
Platelets: 219 10*3/uL (ref 150–400)
RBC: 4.15 MIL/uL (ref 3.87–5.11)
RDW: 14.2 % (ref 11.5–15.5)
WBC: 6 10*3/uL (ref 4.0–10.5)
nRBC: 0 % (ref 0.0–0.2)

## 2020-02-17 LAB — RESP PANEL BY RT-PCR (FLU A&B, COVID) ARPGX2
Influenza A by PCR: NEGATIVE
Influenza B by PCR: NEGATIVE
SARS Coronavirus 2 by RT PCR: POSITIVE — AB

## 2020-02-17 LAB — I-STAT BETA HCG BLOOD, ED (MC, WL, AP ONLY): I-stat hCG, quantitative: <5 m[IU]/mL (ref <5)

## 2020-02-17 MED ORDER — SODIUM CHLORIDE 0.9 % IV SOLN
1.0000 g | Freq: Once | INTRAVENOUS | Status: AC
  Administered 2020-02-17: 1 g via INTRAVENOUS
  Filled 2020-02-17: qty 10

## 2020-02-17 MED ORDER — HEPARIN SODIUM (PORCINE) 5000 UNIT/ML IJ SOLN
5000.0000 [IU] | Freq: Three times a day (TID) | INTRAMUSCULAR | Status: DC
  Administered 2020-02-18 (×2): 5000 [IU] via SUBCUTANEOUS
  Filled 2020-02-17 (×3): qty 1

## 2020-02-17 MED ORDER — LACTATED RINGERS IV BOLUS: 500.0000 mL | Freq: Once | INTRAVENOUS | Status: DC

## 2020-02-17 MED ORDER — LACTATED RINGERS IV SOLN: INTRAVENOUS | Status: AC

## 2020-02-17 MED ORDER — LACTATED RINGERS IV BOLUS
500.0000 mL | Freq: Once | INTRAVENOUS | Status: AC
  Administered 2020-02-17: 500 mL via INTRAVENOUS

## 2020-02-17 MED ORDER — ACETAMINOPHEN 500 MG PO TABS
500.0000 mg | ORAL_TABLET | Freq: Once | ORAL | Status: AC
  Administered 2020-02-17: 500 mg via ORAL
  Filled 2020-02-17: qty 1

## 2020-02-17 MED ORDER — ACETAMINOPHEN 650 MG RE SUPP: 650.0000 mg | Freq: Four times a day (QID) | RECTAL | Status: DC | PRN

## 2020-02-17 MED ORDER — ACETAMINOPHEN 325 MG PO TABS
650.0000 mg | ORAL_TABLET | Freq: Four times a day (QID) | ORAL | Status: DC | PRN
  Administered 2020-02-18: 650 mg via ORAL
  Filled 2020-02-17: qty 2

## 2020-02-17 NOTE — ED Notes (Signed)
Pt fed per admitting providers.

## 2020-02-17 NOTE — H&P (Addendum)
[facility] Admission History and Physical Service Pager: [phone]  Patient name: Whitney Bennett Medical record number: [id number] Date of birth: 05-03-1985 Age: 34 y.o. Gender: female  Primary Care Provider: Mack Hook, MD Consultants: None Code Status: Full Preferred Emergency Contact: Husband  Chief Complaint: Fever/dysuria  Assessment and Plan: Whitney Bennett is a 34 y.o. female presenting with fever, dysuria, and myalgias, found to have significant AKI.  PMH is significant for FSGN, anxiety and depression, and hyperlipidemia.   Nonoliguric AKI on CKD +/- progression of CKD:  Patient indicates she has had 2 days of fever and myalgias.  Had fever of 101.1 on arrival,  currently Afebrile s/p tylenol.  Patient also complaining to myalgias in both legs and pain in left CVA for past 2 days. Indicates leg and back pain improved following LR bolus/abx in ED.  Has history of CKD due FSGN (biopsy confirmed in 2020) and follows with Dr Joelyn Oms with [facility]. Exam unremarkable, however ED provider did elicit left CVA tenderness. Cr currently 3.82, BUN 24.  GFR-15. UA and urine microscopy show significant blood, but patient indicates she is currently menstruating.  Less clear of patient's current Cr baseline as last BMP > 1 year ago showed Creatinine of 1.9 with GFR of 33 (not sure if she has had progression since this time however unlikely).  Given history of fever, urinary symptoms, and CVA tenderness, concern for UTI/Pyelonephritis which may be contributing.  However, do suspect her likely AKI is multifactorial in the setting of home losartan use, possible dehydration, and concurrent infection (COVID-19 and suspected UTI/Pyelo as above).  Reassured by CT renal stone without any evidence of hydronephrosis or significant renal inflammation. - Admit to FPTS, Attending Dr Thompson Grayer - Consider nephrology consult in AM given her history/pending Cr  trend   - Cont IV Ceftriaxone 1 mg qd - Start mIVF LR - BCx and UCx pending - CK pending - Hold home Losartan - Vital signs q4hr - AM CMP, CBC, CK  - Tylenol 650 mg PO for pain/fever - Monitor I&O  Concern for UTI/pyelonephritis: Discussed as above, presented with dysuria, urinary frequency/urgency, CVA tenderness, and fever.  U/a with large blood due to current menstruation, thus unable to be read completely. LA 0.9. However, understand fever may be related to COVID-19 infection.  -Continue IV ceftriaxone -Will need to repeat urinalysis outpatient to ensure hematuria has resolved  COVID-19 positive Mildly symptomatic with sore throat, headaches, congestion/cough.  Currently saturating >95% on RA without any difficulty breathing.  May also be possible cause of back pain and myalgias as well as fevers.  Will hold off on any remdesivir or steroids given mild illness. - Continuous Pulse Oximetry - Maintain O2 Sats >90% - Tylenol 650 mg PO for pain/fever  FSGN: Chronic. Official diagnosis via biopsy in 09/2018.  Follows with [facility], Dr. Carmina Miller. Cr 1.94 in 09/2018.  Reports she follows a strict diet and monitors her pressures well. -Renal management as above #1  Normocytic anemia: Chronic, stable. Hemoglobin 11, BL around 12.  Unclear etiology, could consider early iron deficiency anemia due to menstruation vs related to chronic kidney disease. -Monitor CBC -Follow-up outpatient with PCP   FEN/GI: Full Diet Prophylaxis: Heparin  Disposition: Med/Surg  History of Present Illness:  Whitney Bennett is a 34 y.o. female presenting with 2 days of fever and dysuria. In addition to this she also reports a 2-day history of sore throat with intermittent headaches and mild nasal congestion with  cough. Denies any difficulty breathing. With her dysuria she has had some urinary frequency and urgency. Flank pain, more on L>R that has now improved. Lower abdominal cramping, indicates  currently menstruating and pain similar to past menstrual cramps. Fully vaccinated, had not got her booster shot yet but is scheduled to get in early December. She hasn't eaten much today but has continued to drink water.  Denies N/V. No Nsaid use.  No new medications, started losartan approximately 5 months ago.  Has history of CKD.   Sees Dr. Joelyn Oms with [facility].  Diagnosed with FSGN 1.5 years ago.  Last seen 5 months ago.  Currently controlled with diet and Losartan.   ED Course:  Tested positive for COVID-19.  CR 3.82.  Urine pregnancy test negative.  Patient believed she may have contracted in recent visit to [place].  Also found to have fever of 101.1 which resolved with Tylenol. CT renal study was collected which showed a punctate nonobstructing stone at the upper pole of her left kidney without any additional acute or focal lesion to explain symptoms. Received 500 mL LR bolus and indicates she is feeling better.  Family medicine was consulted for admission due to AKI.   Review Of Systems: Per HPI with the following additions:   Review of Systems  Constitutional: Negative for activity change, chills and fever.  HENT: Positive for congestion and sore throat. Negative for trouble swallowing.   Respiratory: Positive for cough. Negative for shortness of breath.   Cardiovascular: Negative for chest pain.  Gastrointestinal: Negative for constipation, diarrhea, nausea and vomiting.  Endocrine: Positive for polydipsia.  Genitourinary: Positive for pelvic pain. Negative for difficulty urinating.  Musculoskeletal: Positive for back pain and myalgias.  Skin: Negative for rash.     Patient Active Problem List   Diagnosis Date Noted  . AKI (acute kidney injury) ([place]) 02/17/2020  . Mixed hyperlipidemia 08/24/2018  . Anxiety and depression 08/24/2018  . Chronic kidney disease   . Breast tenderness in female 11/14/2015  . MONILIAL VAGINITIS 09/20/2008  . VISUAL IMPAIRMENT 09/20/2008  .  HEARING DEFICIT 09/20/2008  . ABSCESS, TOOTH 06/10/2008  . URINARY TRACT INFECTION, RECURRENT 06/10/2008  . OTH D/O MENSTRUATION&OTH ABN BLEED FE GNT TRACT 06/10/2008    Past Medical History: Past Medical History:  Diagnosis Date  . Anxiety   . Chronic kidney disease    with proteinuria    Past Surgical History: Past Surgical History:  Procedure Laterality Date  . NO PAST SURGERIES      Social History: Social History   Tobacco Use  . Smoking status: Never Smoker  . Smokeless tobacco: Never Used  Substance Use Topics  . Alcohol use: Yes    Alcohol/week: 1.0 - 2.0 standard drink    Types: 1 - 2 Standard drinks or equivalent per week    Comment: quit 2019.  Previously occasionally  . Drug use: No   Additional social history: Please also refer to relevant sections of EMR.  Family History: Family History  Problem Relation Age of Onset  . Diabetes Mother   . Thyroid disease Mother   . Cervical polyp Mother   . Post-traumatic stress disorder Mother   . Hypercholesterolemia Father   . Depression Father   . Hypertension Father   . Heart disease Brother   . Diabetes Maternal Grandmother   . Diabetes Maternal Grandfather   . Stroke Maternal Grandfather   . Diabetes Paternal Grandmother   . Alcohol abuse Paternal Grandfather   . Alcoholism  Paternal Grandfather   . Other Neg Hx     Allergies and Medications: Allergies  Allergen Reactions  . Lisinopril Cough   No current facility-administered medications on file prior to encounter.   Current Outpatient Medications on File Prior to Encounter  Medication Sig Dispense Refill  . acetaminophen (TYLENOL) 500 MG tablet Take 500-1,000 mg by mouth as needed for mild pain, moderate pain or fever.     Marland Kitchen losartan (COZAAR) 25 MG tablet Take 25 mg by mouth daily.    . sertraline (ZOLOFT) 50 MG tablet 1/2 tab by mouth daily for 7 days, then 1 tab by mouth daily (Patient not taking: Reported on 09/23/2018) 30 tablet 11     Objective: BP 126/85   Pulse 95   Temp 99.1 F (37.3 C) (Oral)   Resp 16   SpO2 97%  Exam:  Physical Exam Constitutional:      General: She is not in acute distress.    Appearance: Normal appearance. She is not ill-appearing.  HENT:     Head: Normocephalic and atraumatic.     Mouth/Throat:     Mouth: Mucous membranes are moist.  Cardiovascular:     Rate and Rhythm: Normal rate and regular rhythm.  Pulmonary:     Effort: Pulmonary effort is normal.     Breath sounds: Normal breath sounds.  Abdominal:     General: Abdomen is flat. Bowel sounds are normal. There is no distension.     Palpations: Abdomen is soft.     Tenderness: There is no abdominal tenderness. There is no right CVA tenderness or left CVA tenderness.  Musculoskeletal:        General: No tenderness.     Right lower leg: No edema.     Left lower leg: No edema.     Comments: 5/5 lower extremity strength  Skin:    General: Skin is warm.     Capillary Refill: Capillary refill takes less than 2 seconds.  Neurological:     General: No focal deficit present.     Mental Status: She is alert.  Psychiatric:        Mood and Affect: Mood normal.        Behavior: Behavior normal.     Labs and Imaging: CBC BMET  Recent Labs  Lab 02/17/20 1335  WBC 6.0  HGB 11.0*  HCT 35.2*  PLT 219   Recent Labs  Lab 02/17/20 1335  NA 134*  K 3.7  CL 107  CO2 16*  BUN 24*  CREATININE 3.82*  GLUCOSE 108*  CALCIUM 7.5Delora Fuel, MD 02/18/2020, 12:24 AM PGY-1, [facility] pager: [phone], text pages welcome  FPTS Upper-Level Resident Addendum   I have independently interviewed and examined the patient. I have discussed the above with the original author and agree with their documentation. My edits for correction/addition/clarification are added. Please see also any attending notes.    Patriciaann Clan, DO  Family Medicine PGY-3

## 2020-02-17 NOTE — ED Provider Notes (Signed)
Care assumed from E. Hammond PA-C at shift change pending CT renal.  See her note for full H&P.  Plan is for admission   Briefly this is female with history of FSGS with multiple complaints including fever, body aches and dysuria. Work-up includes patient being Covid positive with negative CXR  Labs were significant for an AKI, creatinine today is elevated at 3.82 her baseline appears to be around 1.9 compared to labs 2 years ago.  UA unable to be tested because of red cloudy color.  She was given small fluid bolus, did want to fluid overload in the setting of covid. Rocephin was ordered.  Physical Exam  BP 125/73 (BP Location: Right Arm)   Pulse 86   Temp 99 F (37.2 C) (Oral)   Resp 20   SpO2 100%   Physical Exam  PE: Constitutional: well-developed, well-nourished, no apparent distress HENT: normocephalic,  Cardiovascular: normal rate Pulmonary/Chest: effort normal; Abdominal: nondistended Musculoskeletal: no edema Neurological: alert with goal directed thinking Skin: no rash, no diaphoresis Psychiatric: normal mood and affect, normal behavior     ED Course/Procedures     Results for orders placed or performed during the hospital encounter of 02/17/20  Resp Panel by RT-PCR (Flu A&B, Covid) Nasopharyngeal Swab   Specimen: Nasopharyngeal Swab; Nasopharyngeal(NP) swabs in vial transport medium  Result Value Ref Range   SARS Coronavirus 2 by RT PCR POSITIVE (A) NEGATIVE   Influenza A by PCR NEGATIVE NEGATIVE   Influenza B by PCR NEGATIVE NEGATIVE  CBC  Result Value Ref Range   WBC 6.0 4.0 - 10.5 K/uL   RBC 4.15 3.87 - 5.11 MIL/uL   Hemoglobin 11.0 (L) 12.0 - 15.0 g/dL   HCT 35.2 (L) 36 - 46 %   MCV 84.8 80.0 - 100.0 fL   MCH 26.5 26.0 - 34.0 pg   MCHC 31.3 30.0 - 36.0 g/dL   RDW 14.2 11.5 - 15.5 %   Platelets 219 150 - 400 K/uL   nRBC 0.0 0.0 - 0.2 %  Basic metabolic panel  Result Value Ref Range   Sodium 134 (L) 135 - 145 mmol/L   Potassium 3.7 3.5 - 5.1 mmol/L    Chloride 107 98 - 111 mmol/L   CO2 16 (L) 22 - 32 mmol/L   Glucose, Bld 108 (H) 70 - 99 mg/dL   BUN 24 (H) 6 - 20 mg/dL   Creatinine, Ser 3.82 (H) 0.44 - 1.00 mg/dL   Calcium 7.5 (L) 8.9 - 10.3 mg/dL   GFR, Estimated 15 (L) >60 mL/min   Anion gap 11 5 - 15  Urinalysis, Routine w reflex microscopic Nasopharyngeal Swab  Result Value Ref Range   Color, Urine RED (A) YELLOW   APPearance CLOUDY (A) CLEAR   Specific Gravity, Urine  1.005 - 1.030    TEST NOT REPORTED DUE TO COLOR INTERFERENCE OF URINE PIGMENT   pH  5.0 - 8.0    TEST NOT REPORTED DUE TO COLOR INTERFERENCE OF URINE PIGMENT   Glucose, UA (A) NEGATIVE mg/dL    TEST NOT REPORTED DUE TO COLOR INTERFERENCE OF URINE PIGMENT   Hgb urine dipstick (A) NEGATIVE    TEST NOT REPORTED DUE TO COLOR INTERFERENCE OF URINE PIGMENT   Bilirubin Urine (A) NEGATIVE    TEST NOT REPORTED DUE TO COLOR INTERFERENCE OF URINE PIGMENT   Ketones, ur (A) NEGATIVE mg/dL    TEST NOT REPORTED DUE TO COLOR INTERFERENCE OF URINE PIGMENT   Protein, ur (A) NEGATIVE mg/dL  TEST NOT REPORTED DUE TO COLOR INTERFERENCE OF URINE PIGMENT   Nitrite (A) NEGATIVE    TEST NOT REPORTED DUE TO COLOR INTERFERENCE OF URINE PIGMENT   Leukocytes,Ua (A) NEGATIVE    TEST NOT REPORTED DUE TO COLOR INTERFERENCE OF URINE PIGMENT  Hepatic function panel  Result Value Ref Range   Total Protein 6.4 (L) 6.5 - 8.1 g/dL   Albumin 3.1 (L) 3.5 - 5.0 g/dL   AST 15 15 - 41 U/L   ALT 14 0 - 44 U/L   Alkaline Phosphatase 79 38 - 126 U/L   Total Bilirubin 0.5 0.3 - 1.2 mg/dL   Bilirubin, Direct <0.1 0.0 - 0.2 mg/dL   Indirect Bilirubin NOT CALCULATED 0.3 - 0.9 mg/dL  Lactic acid, plasma  Result Value Ref Range   Lactic Acid, Venous 0.7 0.5 - 1.9 mmol/L  Lactic acid, plasma  Result Value Ref Range   Lactic Acid, Venous 1.1 0.5 - 1.9 mmol/L  Urinalysis, Microscopic (reflex)  Result Value Ref Range   RBC / HPF >50 0 - 5 RBC/hpf   WBC, UA 6-10 0 - 5 WBC/hpf   Bacteria, UA MANY (A)  NONE SEEN   Squamous Epithelial / LPF 6-10 0 - 5  I-Stat Beta hCG blood, ED ([facility], [facility], [facility] only)  Result Value Ref Range   I-stat hCG, quantitative <5.0 <5 mIU/mL   Comment 3           DG Chest Portable 1 View  Result Date: 02/17/2020 CLINICAL DATA:  34 year old female with fever and cough. EXAM: PORTABLE CHEST 1 VIEW COMPARISON:  10/20/2015 FINDINGS: The heart size and mediastinal contours are within normal limits. Both lungs are clear. The visualized skeletal structures are unremarkable. IMPRESSION: No active disease. Electronically Signed   By: Ruthann Cancer MD   On: 02/17/2020 14:57   CT Renal Stone Study  Result Date: 02/17/2020 CLINICAL DATA:  Flank pain. Kidney stone suspected. COVID positive. EXAM: CT ABDOMEN AND PELVIS WITHOUT CONTRAST TECHNIQUE: Multidetector CT imaging of the abdomen and pelvis was performed following the standard protocol without IV contrast. COMPARISON:  None. FINDINGS: Lower chest: Lung bases are clear.  Heart size is normal. Hepatobiliary: Liver is unremarkable. Gallbladder is mildly distended without inflammation. The common bile duct is normal. Pancreas: Unremarkable. No pancreatic ductal dilatation or surrounding inflammatory changes. Spleen: The spleen is enlarged, measuring up to 11 cm in coronal dimension. No focal lesion is present. Adrenals/Urinary Tract: A punctate nonobstructing stone is present at the upper pole of the left kidney. No significant right-sided stones are present. No mass lesion is present. No hydronephrosis present. The urinary bladder is mostly collapsed. Stomach/Bowel: Stomach and duodenum are within normal limits. Small bowel is unremarkable. Terminal ileum is within normal limits. The appendix is visualized and normal. The ascending and transverse colon are within normal limits. The descending and sigmoid colon are normal. Vascular/Lymphatic: No significant vascular findings are present. No enlarged abdominal or pelvic lymph nodes.  Reproductive: Uterus and bilateral adnexa are unremarkable. Other: No abdominal wall hernia or abnormality. No abdominopelvic ascites. Musculoskeletal: No acute or significant osseous findings. IMPRESSION: 1. Punctate nonobstructing stone at the upper pole of the left kidney. 2. No other acute or focal lesion to explain the patient's symptoms. 3. Splenomegaly. Electronically Signed   By: San Morelle M.D.   On: 02/17/2020 19:15     MDM  Patient received a handout.  Please see previous provider note to include MDM to this point.    CT  renal shows punctate nonobstructing stone at the upper pole of left kidney, no other acute or focal lesions to explain her symptoms.  She does have splenomegaly.  Patient will need admission for AKI with gentle hydration. Unassigned admission  Spoke with family medicine service who agrees to assume care of patient and bring into the hospital for further evaluation and management.     Portions of this note were generated with Lobbyist. Dictation errors may occur despite best attempts at proofreading.   Whitney Bennett was evaluated in Emergency Department on 02/17/2020 for the symptoms described in the history of present illness. She was evaluated in the context of the global COVID-19 pandemic, which necessitated consideration that the patient might be at risk for infection with the SARS-CoV-2 virus that causes COVID-19. Institutional protocols and algorithms that pertain to the evaluation of patients at risk for COVID-19 are in a state of rapid change based on information released by regulatory bodies including the CDC and federal and state organizations. These policies and algorithms were followed during the patient's care in the ED.       Barrie Folk, PA-C 02/17/20 1954    Breck Coons, MD 02/18/20 1447

## 2020-02-17 NOTE — Discharge Instructions (Addendum)
Dear Whitney Bennett,   Thank you so much for allowing [place] to be part of your care!  You were admitted to [facility] for worsening kidney function.  While you are evaluating the ED you were found to be Covid positive as well.  We have treated you with antibiotics for presumed urinary tract infection and given you fluids.  Fortunately with this you have felt much better, however your kidney function continues to be higher than your previous baseline.  It is very important that you follow-up with your nephrologist, Dr. Joelyn Oms.  Please make sure you continue to stay hydrated.  We have sent an antibiotic for you to finish for the next several days for your UTI.  Additionally, because you are Covid positive, you must continue quarantine for 21 days.  Quarantine would be through at least 03/06/2020. -Avoid others until they meet criteria for ending isolation, which are:  -24 hours with no fever (without use of medicaitons) and -respiratory symptoms have improved (e.g. cough, shortness of breath) or  -21 days since symptoms first appeared (11/23)   POST-HOSPITAL & CARE INSTRUCTIONS 1. Please stop taking losartan until your nephrologist tells you it is okay to resume  2. Please let PCP/Specialists know of any changes that were made.  3. Please see medications section of this packet for any medication changes.   DOCTOR'S APPOINTMENT & FOLLOW UP CARE INSTRUCTIONS  No future appointments.  RETURN PRECAUTIONS: If you have any difficulty breathing, chest pain, or decreased urination.  Take care and be well!  [facility]  [place], [place] [address] [phone]

## 2020-02-17 NOTE — ED Triage Notes (Addendum)
Pt arrives to ED w/ c/o fever, 8/10 mid to upper back pain x 2 days. Pt also endorses painful urination. Pt states she recently travelled to [place] but had a negative covid test since returning. Pt took 500 mg tylenol at 1300 today.

## 2020-02-17 NOTE — ED Provider Notes (Signed)
[facility]ERG[facility] DEPARTMENT Provider Note   CSN: [id number] Arrival date & time: 02/17/20  1317     History Chief Complaint  Patient presents with  . Fever    Whitney Bennett is a 34 y.o. female with a past medical history of FSGS, anxiety and depression, hyperlipidemia, who presents today for evaluation of multiple complaints.  She reports that over the past 2 days she has had fevers.  She states that she is increased urinary frequency last night waking her up 4 times. She reports that she is currently menstruating.  She did travel to [place] and had a negative Covid test before she came back however that was almost 18 days ago that she came back.  She denies any other sick contacts in the family.  She reports sore throat and cough.  Her cough is not productive.  Additionally she reports body aches.  She is vaccinated against Covid, has not had a booster yet. She reports mild frontal headache, denies vomiting.  No pain in her abdomen however she does report pain in her bilateral legs and her back on the left side.    She took 500mg  of tylenol at 1300 today.   HPI     Past Medical History:  Diagnosis Date  . Anxiety   . Chronic kidney disease    with proteinuria    Patient Active Problem List   Diagnosis Date Noted  . Mixed hyperlipidemia 08/24/2018  . Anxiety and depression 08/24/2018  . Chronic kidney disease   . Breast tenderness in female 11/14/2015  . MONILIAL VAGINITIS 09/20/2008  . VISUAL IMPAIRMENT 09/20/2008  . HEARING DEFICIT 09/20/2008  . ABSCESS, TOOTH 06/10/2008  . URINARY TRACT INFECTION, RECURRENT 06/10/2008  . OTH D/O MENSTRUATION&OTH ABN BLEED FE GNT TRACT 06/10/2008    Past Surgical History:  Procedure Laterality Date  . NO PAST SURGERIES       OB History    Gravida  2   Para  1   Term  1   Preterm      AB  0   Living  2     SAB      TAB      Ectopic  0   Multiple      Live Births  1            Family History  Problem Relation Age of Onset  . Diabetes Mother   . Thyroid disease Mother   . Cervical polyp Mother   . Post-traumatic stress disorder Mother   . Hypercholesterolemia Father   . Depression Father   . Hypertension Father   . Heart disease Brother   . Diabetes Maternal Grandmother   . Diabetes Maternal Grandfather   . Stroke Maternal Grandfather   . Diabetes Paternal Grandmother   . Alcohol abuse Paternal Grandfather   . Alcoholism Paternal Grandfather   . Other Neg Hx     Social History   Tobacco Use  . Smoking status: Never Smoker  . Smokeless tobacco: Never Used  Substance Use Topics  . Alcohol use: Yes    Alcohol/week: 1.0 - 2.0 standard drink    Types: 1 - 2 Standard drinks or equivalent per week    Comment: quit 2019.  Previously occasionally  . Drug use: No    Home Medications Prior to Admission medications   Medication Sig Start Date End Date Taking? Authorizing Provider  acetaminophen (TYLENOL) 500 MG tablet Take 500-1,000 mg by mouth as needed  for mild pain, moderate pain or fever.    Yes [provider]  losartan (COZAAR) 25 MG tablet Take 25 mg by mouth daily. 02/08/20  Yes [provider]  sertraline (ZOLOFT) 50 MG tablet 1/2 tab by mouth daily for 7 days, then 1 tab by mouth daily Patient not taking: Reported on 09/23/2018 08/24/18   Mack Hook, MD    Allergies    Lisinopril  Review of Systems   Review of Systems  Constitutional: Positive for fatigue and fever.  Eyes: Negative for visual disturbance.  Respiratory: Positive for cough. Negative for shortness of breath.   Cardiovascular: Negative for chest pain, palpitations and leg swelling.  Gastrointestinal: Negative for abdominal pain, diarrhea, nausea and vomiting.  Genitourinary: Positive for flank pain, frequency and vaginal bleeding (Currently menstruating). Negative for dysuria and pelvic pain.  Musculoskeletal: Negative for back pain and neck pain.   Skin: Negative for color change and rash.  Neurological: Positive for headaches. Negative for weakness.  Psychiatric/Behavioral: Negative for confusion.  All other systems reviewed and are negative.   Physical Exam Updated Vital Signs BP 125/73 (BP Location: Right Arm)   Pulse 86   Temp 99 F (37.2 C) (Oral)   Resp 20   SpO2 100%   Physical Exam Vitals and nursing note reviewed.  Constitutional:      General: She is not in acute distress.    Appearance: She is well-developed. She is not diaphoretic.  HENT:     Head: Normocephalic and atraumatic.     Mouth/Throat:     Mouth: Mucous membranes are moist.     Pharynx: Oropharynx is clear. Uvula midline. No oropharyngeal exudate or posterior oropharyngeal erythema.     Tonsils: No tonsillar exudate or tonsillar abscesses.  Eyes:     General: No scleral icterus.       Right eye: No discharge.        Left eye: No discharge.     Conjunctiva/sclera: Conjunctivae normal.  Cardiovascular:     Rate and Rhythm: Normal rate and regular rhythm.     Pulses: Normal pulses.  Pulmonary:     Effort: Pulmonary effort is normal. No respiratory distress.     Breath sounds: Normal breath sounds and air entry. No stridor.  Abdominal:     General: There is no distension.     Tenderness: There is no abdominal tenderness. There is left CVA tenderness. There is no right CVA tenderness.  Musculoskeletal:        General: No deformity.     Cervical back: Normal range of motion and neck supple.     Right lower leg: No edema.     Left lower leg: No edema.  Skin:    General: Skin is warm and dry.  Neurological:     General: No focal deficit present.     Mental Status: She is alert and oriented to person, place, and time.     Motor: No abnormal muscle tone.  Psychiatric:        Mood and Affect: Mood normal.        Behavior: Behavior normal.     ED Results / Procedures / Treatments   Labs (all labs ordered are listed, but only abnormal  results are displayed) Labs Reviewed  RESP PANEL BY RT-PCR (FLU A&B, COVID) ARPGX2 - Abnormal; Notable for the following components:      Result Value   SARS Coronavirus 2 by RT PCR POSITIVE (*)    All other components  within normal limits  CBC - Abnormal; Notable for the following components:   Hemoglobin 11.0 (*)    HCT 35.2 (*)    All other components within normal limits  BASIC METABOLIC PANEL - Abnormal; Notable for the following components:   Sodium 134 (*)    CO2 16 (*)    Glucose, Bld 108 (*)    BUN 24 (*)    Creatinine, Ser 3.82 (*)    Calcium 7.5 (*)    GFR, Estimated 15 (*)    All other components within normal limits  URINALYSIS, ROUTINE W REFLEX MICROSCOPIC - Abnormal; Notable for the following components:   Color, Urine RED (*)    APPearance CLOUDY (*)    Glucose, UA   (*)    Value: TEST NOT REPORTED DUE TO COLOR INTERFERENCE OF URINE PIGMENT   Hgb urine dipstick   (*)    Value: TEST NOT REPORTED DUE TO COLOR INTERFERENCE OF URINE PIGMENT   Bilirubin Urine   (*)    Value: TEST NOT REPORTED DUE TO COLOR INTERFERENCE OF URINE PIGMENT   Ketones, ur   (*)    Value: TEST NOT REPORTED DUE TO COLOR INTERFERENCE OF URINE PIGMENT   Protein, ur   (*)    Value: TEST NOT REPORTED DUE TO COLOR INTERFERENCE OF URINE PIGMENT   Nitrite   (*)    Value: TEST NOT REPORTED DUE TO COLOR INTERFERENCE OF URINE PIGMENT   Leukocytes,Ua   (*)    Value: TEST NOT REPORTED DUE TO COLOR INTERFERENCE OF URINE PIGMENT   All other components within normal limits  HEPATIC FUNCTION PANEL - Abnormal; Notable for the following components:   Total Protein 6.4 (*)    Albumin 3.1 (*)    All other components within normal limits  URINALYSIS, MICROSCOPIC (REFLEX) - Abnormal; Notable for the following components:   Bacteria, UA MANY (*)    All other components within normal limits  URINE CULTURE  CULTURE, BLOOD (ROUTINE X 2)  CULTURE, BLOOD (ROUTINE X 2)  LACTIC ACID, PLASMA  LACTIC ACID, PLASMA   I-STAT BETA HCG BLOOD, ED ([facility], [facility], [facility] ONLY)    EKG None  Radiology DG Chest Portable 1 View  Result Date: 02/17/2020 CLINICAL DATA:  34 year old female with fever and cough. EXAM: PORTABLE CHEST 1 VIEW COMPARISON:  10/20/2015 FINDINGS: The heart size and mediastinal contours are within normal limits. Both lungs are clear. The visualized skeletal structures are unremarkable. IMPRESSION: No active disease. Electronically Signed   By: Ruthann Cancer MD   On: 02/17/2020 14:57    Procedures Procedures (including critical care time)  Medications Ordered in ED Medications  cefTRIAXone (ROCEPHIN) 1 g in sodium chloride 0.9 % 100 mL IVPB (has no administration in time range)  lactated ringers bolus 500 mL (500 mLs Intravenous New Bag/Given 02/17/20 1444)  acetaminophen (TYLENOL) tablet 500 mg (500 mg Oral Given 02/17/20 1524)    ED Course  I have reviewed the triage vital signs and the nursing notes.  Pertinent labs & imaging results that were available during my care of the patient were reviewed by me and considered in my medical decision making (see chart for details).    MDM Rules/Calculators/A&P                           Whitney Bennett was evaluated in Emergency Department on 02/17/2020 for the symptoms described in the history of present illness. She was evaluated  in the context of the global COVID-19 pandemic, which necessitated consideration that the patient might be at risk for infection with the SARS-CoV-2 virus that causes COVID-19. Institutional protocols and algorithms that pertain to the evaluation of patients at risk for COVID-19 are in a state of rapid change based on information released by regulatory bodies including the CDC and federal and state organizations. These policies and algorithms were followed during the patient's care in the ED.  Whitney Bennett is a 34 year old woman who presents today for evaluation of multiple complaints. Her primary  concern is urinary frequency in addition to cough, sore throat, headache and body aches. Here her Covid test is positive.  She reports that she had the initial 2 vaccines however did not have a booster.  On exam she has left-sided CVA tenderness to percussion.  Her UA microscopy showed many bacteria with over 50 red cells and only 6-10 white cells with 6-10 squamous cells.  Given her relatively low number of white cells increased concern for kidney stone.  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  On arrival here she is febrile and tachycardic.  She is not tachypneic or hypotensive and is saturating above 92% on room air.  CBC shows mild anemia with a hemoglobin of 11, no significant leukocytosis.  CMP is significant for creatinine elevated at 3.82.  Additionally her CO2 is low at 16 with a GFR of 15.  Plan for IV fluids and antibiotics.  Will obtain CT renal given large amount of blood.  Her Cr one year ago was 1.9, is now 3.82.  Concern for AKI.    Anticipate that after patient has CT renal study she may require admission for worsening kidney function.    At shift change care was transferred to [facility] who will follow pending studies, re-evaulate and determine disposition.     Final Clinical Impression(s) / ED Diagnoses Final diagnoses:  Acute renal failure, unspecified acute renal failure type [facility])  COVID-19    Rx / DC Orders ED Discharge Orders    None       Ollen Gross 02/17/20 Denton Lank, MD 02/18/20 [phone]

## 2020-02-18 DIAGNOSIS — U071 COVID-19: Secondary | ICD-10-CM

## 2020-02-18 LAB — CBC WITH DIFFERENTIAL/PLATELET
Abs Immature Granulocytes: 0.02 10*3/uL (ref 0.00–0.07)
Basophils Absolute: 0 10*3/uL (ref 0.0–0.1)
Basophils Relative: 1 %
Eosinophils Absolute: 0.1 10*3/uL (ref 0.0–0.5)
Eosinophils Relative: 1 %
HCT: 34.9 % — ABNORMAL LOW (ref 36.0–46.0)
Hemoglobin: 11 g/dL — ABNORMAL LOW (ref 12.0–15.0)
Immature Granulocytes: 0 %
Lymphocytes Relative: 36 %
Lymphs Abs: 2 10*3/uL (ref 0.7–4.0)
MCH: 27 pg (ref 26.0–34.0)
MCHC: 31.5 g/dL (ref 30.0–36.0)
MCV: 85.5 fL (ref 80.0–100.0)
Monocytes Absolute: 0.6 10*3/uL (ref 0.1–1.0)
Monocytes Relative: 11 %
Neutro Abs: 2.8 10*3/uL (ref 1.7–7.7)
Neutrophils Relative %: 51 %
Platelets: 201 10*3/uL (ref 150–400)
RBC: 4.08 MIL/uL (ref 3.87–5.11)
RDW: 14.1 % (ref 11.5–15.5)
WBC: 5.4 10*3/uL (ref 4.0–10.5)
nRBC: 0 % (ref 0.0–0.2)

## 2020-02-18 LAB — COMPREHENSIVE METABOLIC PANEL
ALT: 15 U/L (ref 0–44)
AST: 15 U/L (ref 15–41)
Albumin: 2.9 g/dL — ABNORMAL LOW (ref 3.5–5.0)
Alkaline Phosphatase: 78 U/L (ref 38–126)
Anion gap: 11 (ref 5–15)
BUN: 23 mg/dL — ABNORMAL HIGH (ref 6–20)
CO2: 18 mmol/L — ABNORMAL LOW (ref 22–32)
Calcium: 7.6 mg/dL — ABNORMAL LOW (ref 8.9–10.3)
Chloride: 107 mmol/L (ref 98–111)
Creatinine, Ser: 3.62 mg/dL — ABNORMAL HIGH (ref 0.44–1.00)
GFR, Estimated: 16 mL/min — ABNORMAL LOW (ref >60)
Glucose, Bld: 98 mg/dL (ref 70–99)
Potassium: 3.6 mmol/L (ref 3.5–5.1)
Sodium: 136 mmol/L (ref 135–145)
Total Bilirubin: 0.5 mg/dL (ref 0.3–1.2)
Total Protein: 6.3 g/dL — ABNORMAL LOW (ref 6.5–8.1)

## 2020-02-18 LAB — CK: Total CK: 153 U/L (ref 38–234)

## 2020-02-18 LAB — URINE CULTURE: Culture: NO GROWTH

## 2020-02-18 LAB — HIV ANTIBODY (ROUTINE TESTING W REFLEX): HIV Screen 4th Generation wRfx: NONREACTIVE

## 2020-02-18 LAB — CREATININE, URINE, RANDOM: Creatinine, Urine: 25.45 mg/dL

## 2020-02-18 LAB — SODIUM, URINE, RANDOM: Sodium, Ur: 56 mmol/L

## 2020-02-18 MED ORDER — SODIUM CHLORIDE 0.9 % IV SOLN
2.0000 g | INTRAVENOUS | Status: DC
  Administered 2020-02-18: 2 g via INTRAVENOUS
  Filled 2020-02-18: qty 20

## 2020-02-18 MED ORDER — SODIUM CHLORIDE 0.9 % IV SOLN: 1.0000 g | INTRAVENOUS | Status: DC

## 2020-02-18 MED ORDER — INFLUENZA VAC SPLIT QUAD 0.5 ML IM SUSY: 0.5000 mL | PREFILLED_SYRINGE | INTRAMUSCULAR | Status: DC | PRN

## 2020-02-18 NOTE — ED Notes (Signed)
Attempted to call report

## 2020-02-18 NOTE — Progress Notes (Signed)
PHARMACY NOTE:  ANTIMICROBIAL RENAL DOSAGE ADJUSTMENT  Current antimicrobial regimen includes a mismatch between antimicrobial dosage and estimated renal function.  As per policy approved by the Pharmacy & Therapeutics and Medical Executive Committees, the antimicrobial dosage will be adjusted accordingly.  Current antimicrobial dosage:  Ceftriaxone 1g daily  Indication: possible UTI    Antimicrobial dosage has been changed to:  Ceftriaxone 2g daily   Thank you for allowing pharmacy to be a part of this patient's care.  Alfonse Spruce, PharmD PGY2 ID Pharmacy Resident Phone between 7 am - 3:30 pm: [phone]  Please check AMION for all [facility] phone numbers After 10:00 PM, call [facility] [phone]  02/18/2020 10:31 AM

## 2020-02-18 NOTE — Progress Notes (Signed)
Family Medicine Teaching Service Daily Progress Note Intern Pager: [phone]  Patient name: Whitney Bennett Medical record number: [id number] Date of birth: 11-13-1985 Age: 34 y.o. Gender: female  Primary Care Provider: Mack Hook, MD Consultants: None  Code Status: Full  Pt Overview and Major Events to Date:  Admitted 11/25   Assessment and Plan: Whitney Bennett is a 34 y.o. female presenting with fever, dysuria, and myalgias, found to have significant AKI. PMH is significant for FSGN, anxiety and depression, and hyperlipidemia.  Nonoliguric AKI on CKD +/- progression of CKD IIIB:  Improving, minimally changed. Cr 3.52 from 3.82 on admit.  Presumed baseline Cr 1.9 (in 09/2018). No significant electrolyte derangements. Fena 5.6%, however likely noncontributory due to collected after significant IV hydration (no obstruction on CT). Suspect multifactorial with dehydration and concurrent COVID-19, in addition to unfortunately possible progression of her CKD.  -Nephrology consulted, appreciate any further recommendations for disposition -Will need to follow-up with her nephrologist, Dr. Joelyn Oms outpatient -S/p IVF, oral hydration -Avoid nephrotoxic medications, holding home losartan and will hold at discharge -BMP -Monitor I&O -Monitor vitals  UTI/pyelonephritis: Improving.  Afebrile >24 hours. No urinary sx remaining.  Interestingly urine culture showing no growth, unclear if this was obtained after antibiotics.  However, given symptoms on arrival will finish treatment.  BC no growth at 2 days. -Switch IV ceftriaxone to Keflex 500 BID (renally dosed) -Monitor BMP as above -Follow blood cultures -Will need to repeat urinalysis outpatient (hematuria but on menstrual cycle)  COVID-19 positive: Mild, stable.  URI symptoms predominantly, still no respiratory concern.  Will need to isolate for 10 days from 11/25. -Tylenol as needed for fever -Monitor respiratory  status -Airborne precautions  FSGN: Chronic. Official diagnosis via biopsy in 09/2018.  Follows with [facility], Dr. Carmina Miller.  Reports she follows a strict diet and monitors her blood pressure. -Renal management as above #1 -Will need to follow-up outpatient  Normocytic anemia: Chronic, stable. Hemoglobin 11, BL around 12.  Unclear etiology, could consider early iron deficiency anemia due to menstruation vs related to chronic kidney disease. -Monitor CBC -Follow-up outpatient with PCP   FEN/GI: Regular diet  Prophylaxis: Heparin   Status is: Inpatient  Dispo: The patient is from: Home              Anticipated d/c is to: Home              Anticipated d/c date is: Possibly today pending nephrology clearance   Subjective:  No acute events overnight.  She states she is doing very well this morning, feels almost back to baseline.  Denies any further dysuria, myalgias, back pain.  She does not have any difficulty breathing.  Still has a little bit of congestion and sore throat.  Objective: Temp:  [97.8 F (36.6 C)-99.1 F (37.3 C)] 98.5 F (36.9 C) (11/26 2004) Pulse Rate:  [74-100] 84 (11/26 2004) Resp:  [16-19] 18 (11/26 2004) BP: (114-139)/(73-103) 125/89 (11/26 2004) SpO2:  [94 %-100 %] 100 % (11/26 2004) Weight:  [103 kg] 103 kg (11/26 1001) Physical Exam: General: Alert, NAD, sitting up in bed  HEENT: NCAT  Lungs: No increased WOB  Abdomen: non-tender Msk: Moves all extremities spontaneously  Ext: No edema b/l  Laboratory: Recent Labs  Lab 02/17/20 1335 02/18/20 0323  WBC 6.0 5.4  HGB 11.0* 11.0*  HCT 35.2* 34.9*  PLT 219 201   Recent Labs  Lab 02/17/20 1335 02/17/20 1526 02/18/20 0324  NA 134*  --  136  K 3.7  --  3.6  CL 107  --  107  CO2 16*  --  18*  BUN 24*  --  23*  CREATININE 3.82*  --  3.62*  CALCIUM 7.5*  --  7.6*  PROT  --  6.4* 6.3*  BILITOT  --  0.5 0.5  ALKPHOS  --  79 78  ALT  --  14 15  AST  --  15 15  GLUCOSE 108*  --   98     Imaging/Diagnostic Tests: No results found.  Patriciaann Clan, DO 02/18/2020, 8:54 PM PGY-3, [facility] Intern pager: [phone], text pages welcome

## 2020-02-18 NOTE — Plan of Care (Signed)
  Problem: Education: Goal: Knowledge of risk factors and measures for prevention of condition will improve Outcome: Progressing   Problem: Coping: Goal: Psychosocial and spiritual needs will be supported Outcome: Progressing   Problem: Respiratory: Goal: Will maintain a patent airway Outcome: Progressing Goal: Complications related to the disease process, condition or treatment will be avoided or minimized Outcome: Progressing   

## 2020-02-18 NOTE — Consult Note (Signed)
Nephrology Consult   Requesting provider: Yehuda Sav[facility] Service requesting consult: Family Medicine Reason for consult: AKI? On CKD   Assessment/Recommendations: Whitney Bennett is a/an 34 y.o. female with a past medical history obesity, CKD, and FSGS who present w/ COVID, Pyelonephritis, and possible AKI  Non-Oliguric AKI on CKD: CKD secondary to FSGS which is felt to be related to obesity. No nephrotic syndrome (albumin 3.1). No labs for about 1.5 years.  Most likely has some component of AKI but also likely some progression of her underlying CKD.  AKI likely secondary to dehydration and pyelonephritis -Agree with antibiotics and IV hydration -Continue monitor creatinine tomorrow; if stable or improving we will likely sign off as her creatinine rise is possibly chest progression of her CKD -Agree with holding losartan; would hold at discharge with plans to resume outpatient -Needs follow-up with Dr. Joelyn Oms after discharge -Continue to monitor daily Cr, Dose meds for GFR -Monitor Daily I/Os, Daily weight  -Maintain MAP>65 for optimal renal perfusion.  -Avoid nephrotoxic medications including NSAIDs and Vanc/Zosyn combo -CT scan without obstruction -Currently no indication for HD  Non-anion gap metabolic acidosis: Bicarb 18 today.  Continue to monitor.  Hopefully will improve with hydration  Anemia: CKD possibly contributing.  Minimal decrease at this time continue to monitor.  Pyelonephritis: Pyuria and hematuria on urinalysis.  Concern for possible UTI.  No significant kidney stone burden on CT scan.  Continue ceftriaxone for now  COVID: Minimal symptoms at this time.  Defer management to primary team   Recommendations conveyed to primary service.    [facility] 02/18/2020 10:47 AM   _____________________________________________________________________________________ CC: AKI?  On CKD  History of Present Illness: Whitney Bennett is a/an 34 y.o. female with a past medical history of FSGS and obesity who presents with fevers, chills, and flank pain.  Patient presented to the emergency department yesterday with fevers and dysuria.  She has had the symptoms for several days.  She also endorses sore throat, headaches, congestion.  She recently visited her family in [place] and is worried that she got Covid there, fully vaccinated.  She also noticed bilateral back pain in the flanks that was worse on her left side.  She has some dysuria that she associated with likely a UTI.  She noted that she was not urinating as much.  She had some mild nausea.  She did not use any NSAIDs.  She came to the emergency department for further evaluation.  She was found to be Covid positive and urinalysis was significant for pyuria and hematuria.  CT scan demonstrated nonobstructing kidney stones without any signs of obstruction.  She was given IV fluids and her symptoms significantly improved.  Creatinine was 3.8 on arrival and improved to 3.6.  She sees Dr. Joelyn Oms with [facility].  She has not been seen since July 2020.  I do not see any results for creatinine since that time.  Her creatinine in July 2020 was 1.9.  She has FSGS thought to be secondary to obesity.  She has not been treated with immunosuppression.  Medications:  Current Facility-Administered Medications  Medication Dose Route Frequency Provider Last Rate Last Admin  . acetaminophen (TYLENOL) tablet 650 mg  650 mg Oral Q6H PRN Patriciaann Clan, DO   650 mg at 02/18/20 0981   Or  . acetaminophen (TYLENOL) suppository 650 mg  650 mg Rectal Q6H PRN Darrelyn Hillock N, DO      . cefTRIAXone (ROCEPHIN) 2  g in sodium chloride 0.9 % 100 mL IVPB  2 g Intravenous Q24H Esmond Plants, RPH      . heparin injection 5,000 Units  5,000 Units Subcutaneous Q8H Beard, Samantha N, DO      . influenza vac split quadrivalent PF (FLUARIX) injection 0.5 mL  0.5 mL Intramuscular Prior to  discharge Lenoria Chime, MD      . lactated ringers infusion   Intravenous Continuous Patriciaann Clan, DO 125 mL/hr at 02/17/20 2323 New Bag at 02/17/20 2323     ALLERGIES Lisinopril  MEDICAL HISTORY Past Medical History:  Diagnosis Date  . Anxiety   . Chronic kidney disease    with proteinuria     SOCIAL HISTORY Social History   Socioeconomic History  . Marital status: Married    Spouse name: Not on file  . Number of children: 2  . Years of education: 21  . Highest education level: Not on file  Occupational History  . 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 Smoking status: Never Smoker  . Smokeless tobacco: Never Used  Substance and Sexual Activity  . Alcohol use: Yes    Alcohol/week: 1.0 - 2.0 standard drink    Types: 1 - 2 Standard drinks or equivalent per week    Comment: quit 2019.  Previously occasionally  . Drug use: No  . Sexual activity: Yes    Partners: Male    Birth control/protection: Condom  Other Topics Concern  . Not on file  Social History Narrative   Lives at home with husband and 2 daughters.  2 cats, a dog and her father.   Social Determinants of Health   Financial Resource Strain:   . Difficulty of Paying Living Expenses: Not on file  Food Insecurity:   . Worried About Charity fundraiser in the Last Year: Not on file  . Ran Out of Food in the Last Year: Not on file  Transportation Needs:   . Lack of Transportation (Medical): Not on file  . Lack of Transportation (Non-Medical): Not on file  Physical Activity:   . Days of Exercise per Week: Not on file  . Minutes of Exercise per Session: Not on file  Stress:   . Feeling of Stress : Not on file  Social Connections:   . Frequency of Communication with Friends and Family: Not on file  . Frequency of Social Gatherings with Friends and Family: Not on file  . Attends Religious Services: Not on file  . Active Member of Clubs or Organizations: Not on file  .  Attends Archivist Meetings: Not on file  . Marital Status: Not on file  Intimate Partner Violence:   . Fear of Current or Ex-Partner: Not on file  . Emotionally Abused: Not on file  . Physically Abused: Not on file  . Sexually Abused: Not on file     FAMILY HISTORY Family History  Problem Relation Age of Onset  . Diabetes Mother   . Thyroid disease Mother   . Cervical polyp Mother   . Post-traumatic stress disorder Mother   . Hypercholesterolemia Father   . Depression Father   . Hypertension Father   . Heart disease Brother   . Diabetes Maternal Grandmother   . Diabetes Maternal Grandfather   . Stroke Maternal Grandfather   . Diabetes Paternal Grandmother   . Alcohol abuse Paternal Grandfather   . Alcoholism Paternal Grandfather   . Other  Neg Hx       Review of Systems: 12 systems reviewed Otherwise as per HPI, all other systems reviewed and negative  Physical Exam: Vitals:   02/18/20 0921 02/18/20 1001  BP:  127/88  Pulse:  84  Resp:  18  Temp: 97.8 F (36.6 C) 98.2 F (36.8 C)  SpO2:  100%   No intake/output data recorded.  Intake/Output Summary (Last 24 hours) at 02/18/2020 1047 Last data filed at 02/17/2020 2102 Gross per 24 hour  Intake 600 ml  Output --  Net 600 ml   General: Tired appearing, no obvious distress HEENT: anicteric sclera, oropharynx clear without lesions CV: Normal rate, no audible murmur, no edema Lungs: Bilateral chest rise with no increased work of breathing, no oxygen needed Abd: soft, non-tender, non-distended Skin: no visible lesions or rashes Psych: alert, engaged, appropriate mood and affect Musculoskeletal: no obvious deformities Neuro: normal speech, no gross focal deficits   Test Results Reviewed Lab Results  Component Value Date   NA 136 02/18/2020   K 3.6 02/18/2020   CL 107 02/18/2020   CO2 18 (L) 02/18/2020   BUN 23 (H) 02/18/2020   CREATININE 3.62 (H) 02/18/2020   CALCIUM 7.6 (L) 02/18/2020    ALBUMIN 2.9 (L) 02/18/2020     I have reviewed all relevant outside healthcare records related to the patient's current hospitalization

## 2020-02-18 NOTE — Progress Notes (Addendum)
Family Medicine Teaching Service Daily Progress Note Intern Pager: [phone]  Patient name: Whitney Bennett Medical record number: [id number] Date of birth: October 17, 1985 Age: 34 y.o. Gender: female  Primary Care Provider: Mack Hook, MD Consultants: None  Code Status: Full  Pt Overview and Major Events to Date:  Admitted 11/25   Assessment and Plan: Whitney Bennett is a 34 y.o. female presenting with fever, dysuria, and myalgias, found to have significant AKI. PMH is significant for FSGN, anxiety and depression, and hyperlipidemia.  Nonoliguric AKI on CKD +/- progression of CKD IIIB:  Improving. Cr 3.62 from 3.82 yesterday. Assumed baseline around Cr 1.9, last known in Epic 09/2018.  CK 153, likely noncontributory.  No significant electrolyte derangements.  Suspect multifactorial with likely prerenal/intrinsic in the setting of home losartan, dehydration, and concurrent COVID-19/complicated UTI.  No evidence of obstruction on CT. -Consult nephrology with history of FSGN for any additional assistance -Continue LR at 125 ml/hour -Avoid nephrotoxic medications, holding home losartan -BMP -Monitor I&O -Monitor vitals  UTI/pyelonephritis: Improving.  Afebrile. Minimal dysuria remaining. -Continue IV ceftriaxone, anticipate transition to oral tomorrow, 11/27 -Monitor BMP as above -Follow-up blood/urine cultures obtained in ED -Will need to repeat urinalysis outpatient (hematuria but on menstrual cycle)  COVID-19 positive: Mild, stable.  URI symptoms predominantly, no respiratory concern.  Will discuss MAB, however likely will hold off so that she can receive her booster as scheduled in 02/2020. -Tylenol as needed for fever -Monitor respiratory status -Airborne precautions  FSGN: Chronic. Official diagnosis via biopsy in 09/2018.  Follows with [facility], Dr. Carmina Miller. Cr 1.94 in 09/2018.  Reports she follows a strict diet and monitors her blood  pressure. -Renal management as above #1  Normocytic anemia: Chronic, stable. Hemoglobin 11, BL around 12.  Unclear etiology, could consider early iron deficiency anemia due to menstruation vs related to chronic kidney disease. -Monitor CBC -Follow-up outpatient with PCP   FEN/GI: Regular diet  Prophylaxis: Heparin   Status is: Inpatient  Remains inpatient appropriate because:Inpatient level of care appropriate due to severity of illness   Dispo: The patient is from: Home              Anticipated d/c is to: Home              Anticipated d/c date is: 1 day              Patient currently is not medically stable to d/c.   Subjective:  Admitted overnight.  Feeling much better this morning, denies any further myalgias, dysuria, or back pain.  Feels like the fluids helped.   Objective: Temp:  [99 F (37.2 C)-101.1 F (38.4 C)] 99.1 F (37.3 C) (11/25 2200) Pulse Rate:  [81-103] 92 (11/26 0500) Resp:  [15-20] 16 (11/26 0500) BP: (109-153)/(69-103) 128/87 (11/26 0500) SpO2:  [94 %-100 %] 100 % (11/26 0500) Physical Exam: General: Alert, NAD HEENT: NCAT, MMM Lungs: No increased WOB  Abdomen: soft, non-tender, non-distended, no CVA tenderness bilaterally Msk: Moves all extremities spontaneously  Ext: Warm, dry, 2+ distal pulses, no edema b/l  Laboratory: Recent Labs  Lab 02/17/20 1335 02/18/20 0323  WBC 6.0 5.4  HGB 11.0* 11.0*  HCT 35.2* 34.9*  PLT 219 201   Recent Labs  Lab 02/17/20 1335 02/17/20 1526 02/18/20 0324  NA 134*  --  136  K 3.7  --  3.6  CL 107  --  107  CO2 16*  --  18*  BUN 24*  --  23*  CREATININE 3.82*  --  3.62*  CALCIUM 7.5*  --  7.6*  PROT  --  6.4* 6.3*  BILITOT  --  0.5 0.5  ALKPHOS  --  79 78  ALT  --  14 15  AST  --  15 15  GLUCOSE 108*  --  98     Imaging/Diagnostic Tests: DG Chest Portable 1 View  Result Date: 02/17/2020 CLINICAL DATA:  34 year old female with fever and cough. EXAM: PORTABLE CHEST 1 VIEW COMPARISON:   10/20/2015 FINDINGS: The heart size and mediastinal contours are within normal limits. Both lungs are clear. The visualized skeletal structures are unremarkable. IMPRESSION: No active disease. Electronically Signed   By: Ruthann Cancer MD   On: 02/17/2020 14:57   CT Renal Stone Study  Result Date: 02/17/2020 CLINICAL DATA:  Flank pain. Kidney stone suspected. COVID positive. EXAM: CT ABDOMEN AND PELVIS WITHOUT CONTRAST TECHNIQUE: Multidetector CT imaging of the abdomen and pelvis was performed following the standard protocol without IV contrast. COMPARISON:  None. FINDINGS: Lower chest: Lung bases are clear.  Heart size is normal. Hepatobiliary: Liver is unremarkable. Gallbladder is mildly distended without inflammation. The common bile duct is normal. Pancreas: Unremarkable. No pancreatic ductal dilatation or surrounding inflammatory changes. Spleen: The spleen is enlarged, measuring up to 11 cm in coronal dimension. No focal lesion is present. Adrenals/Urinary Tract: A punctate nonobstructing stone is present at the upper pole of the left kidney. No significant right-sided stones are present. No mass lesion is present. No hydronephrosis present. The urinary bladder is mostly collapsed. Stomach/Bowel: Stomach and duodenum are within normal limits. Small bowel is unremarkable. Terminal ileum is within normal limits. The appendix is visualized and normal. The ascending and transverse colon are within normal limits. The descending and sigmoid colon are normal. Vascular/Lymphatic: No significant vascular findings are present. No enlarged abdominal or pelvic lymph nodes. Reproductive: Uterus and bilateral adnexa are unremarkable. Other: No abdominal wall hernia or abnormality. No abdominopelvic ascites. Musculoskeletal: No acute or significant osseous findings. IMPRESSION: 1. Punctate nonobstructing stone at the upper pole of the left kidney. 2. No other acute or focal lesion to explain the patient's symptoms. 3.  Splenomegaly. Electronically Signed   By: San Morelle M.D.   On: 02/17/2020 19:15    Patriciaann Clan, DO 02/18/2020, 5:45 AM PGY-3, [place] Intern pager: [phone], text pages welcome

## 2020-02-18 NOTE — ED Notes (Signed)
Pt given sandwich meal per request; is aware breakfast tray will be here this morning.

## 2020-02-18 NOTE — TOC Initial Note (Signed)
Transition of Care [facility]) - Initial/Assessment Note    Patient Details  Name: Whitney Bennett MRN: [id number] Date of Birth: [birth year]  [facility] CM/SW Contact:    Verdell Carmine, RN Phone Number: 02/18/2020, 1:28 PM  Clinical Narrative:                 Admitted with AKI COVID positive was supposed to follow up with clinic, however  Is uninsured. Does list a PCP. Will need to follow with [place] clinic, Will have to check on employment and ability to pay for medications.   Expected Discharge Plan: Home/Self Care Barriers to Discharge: Continued Medical Work up   Patient Goals and CMS Choice        Expected Discharge Plan and Services Expected Discharge Plan: Home/Self Care       Living arrangements for the past 2 months: Single Family Home                                      Prior Living Arrangements/Services Living arrangements for the past 2 months: Single Family Home Lives with:: Spouse Patient language and need for interpreter reviewed:: Yes        Need for Family Participation in Patient Care: Yes (Comment) Care giver support system in place?: Yes (comment)   Criminal Activity/Legal Involvement Pertinent to Current Situation/Hospitalization: No - Comment as needed  Activities of Daily Living Home Assistive Devices/Equipment: None ADL Screening (condition at time of admission) Patient's cognitive ability adequate to safely complete daily activities?: Yes Is the patient deaf or have difficulty hearing?: No Does the patient have difficulty seeing, even when wearing glasses/contacts?: No Does the patient have difficulty concentrating, remembering, or making decisions?: No Patient able to express need for assistance with ADLs?: No Does the patient have difficulty dressing or bathing?: No Independently performs ADLs?: Yes (appropriate for developmental age) Does the patient have difficulty walking or climbing stairs?: No Weakness  of Legs: None Weakness of Arms/Hands: None  Permission Sought/Granted                  Emotional Assessment       Orientation: : Oriented to Self, Oriented to Place, Oriented to  Time, Oriented to Situation Alcohol / Substance Use: Not Applicable Psych Involvement: No (comment)  Admission diagnosis:  AKI (acute kidney injury) ([place]) [N17.9] Acute renal failure, unspecified acute renal failure type ([place]) [N17.9] COVID-19 [U07.1] Patient Active Problem List   Diagnosis Date Noted  . AKI (acute kidney injury) ([place]) 02/17/2020  . Mixed hyperlipidemia 08/24/2018  . Anxiety and depression 08/24/2018  . Chronic kidney disease   . Breast tenderness in female 11/14/2015  . MONILIAL VAGINITIS 09/20/2008  . VISUAL IMPAIRMENT 09/20/2008  . HEARING DEFICIT 09/20/2008  . ABSCESS, TOOTH 06/10/2008  . URINARY TRACT INFECTION, RECURRENT 06/10/2008  . OTH D/O MENSTRUATION&OTH ABN BLEED FE GNT TRACT 06/10/2008   PCP:  Mack Hook, MD Pharmacy:   [facility] [address] [place]), [place] - [address] [place]) [place] [address] Phone: [phone] Fax: [phone]     Social Determinants of Health (SDOH) Interventions    Readmission Risk Interventions No flowsheet data found.

## 2020-02-19 DIAGNOSIS — N10 Acute tubulo-interstitial nephritis: Secondary | ICD-10-CM

## 2020-02-19 DIAGNOSIS — R509 Fever, unspecified: Secondary | ICD-10-CM

## 2020-02-19 LAB — BASIC METABOLIC PANEL
Anion gap: 11 (ref 5–15)
BUN: 26 mg/dL — ABNORMAL HIGH (ref 6–20)
CO2: 19 mmol/L — ABNORMAL LOW (ref 22–32)
Calcium: 7.6 mg/dL — ABNORMAL LOW (ref 8.9–10.3)
Chloride: 108 mmol/L (ref 98–111)
Creatinine, Ser: 3.52 mg/dL — ABNORMAL HIGH (ref 0.44–1.00)
GFR, Estimated: 17 mL/min — ABNORMAL LOW (ref >60)
Glucose, Bld: 97 mg/dL (ref 70–99)
Potassium: 3.7 mmol/L (ref 3.5–5.1)
Sodium: 138 mmol/L (ref 135–145)

## 2020-02-19 LAB — PROTEIN / CREATININE RATIO, URINE
Creatinine, Urine: 40.36 mg/dL
Protein Creatinine Ratio: 3.64 mg/mg{Cre} — ABNORMAL HIGH (ref 0.00–0.15)
Total Protein, Urine: 147 mg/dL

## 2020-02-19 MED ORDER — CEPHALEXIN 500 MG PO CAPS: 500.0000 mg | ORAL_CAPSULE | Freq: Two times a day (BID) | ORAL | 0 refills | Status: AC

## 2020-02-19 MED ORDER — CEPHALEXIN 500 MG PO CAPS
500.0000 mg | ORAL_CAPSULE | Freq: Two times a day (BID) | ORAL | Status: DC
  Administered 2020-02-19: 500 mg via ORAL
  Filled 2020-02-19: qty 1

## 2020-02-19 MED ORDER — CEPHALEXIN 500 MG PO CAPS: 500.0000 mg | ORAL_CAPSULE | Freq: Two times a day (BID) | ORAL | Status: DC

## 2020-02-19 NOTE — Progress Notes (Signed)
Nephrology Follow-Up Consult note   Assessment/Recommendations: Whitney Bennett is a/an 34 y.o. female with a past medical history significant for obesity, CKD, and FSGS who present w/ COVID, Pyelonephritis, and possible AKI  Non-Oliguric AKI on CKD3b: CKD secondary to FSGS which is felt to be related to obesity. No nephrotic syndrome (albumin 3.1). No labs for about 1.5 years.  Most likely has some component of AKI but also likely some progression of her underlying CKD.  AKI likely secondary to dehydration and pyelonephritis. Improving slowly -Oral hydration sufficient -Stable for DC -Would hold losartan at DC and can restart outpt -Will have office call to arrange f/u w/ Dr. Joelyn Oms  Non-anion gap metabolic acidosis: Bicarb 19. No therapy needed at DC. CTM outpt.  Anemia: CKD possibly contributing.  Minimal decrease at this time continue to monitor.  Pyelonephritis: Pyuria and hematuria on urinalysis and symptoms concerning for UTI. Abx per primary team  COVID: Minimal symptoms at this time.  Defer management to primary team   Recommendations conveyed to primary service.  Patient is stable for discharge from nephrology standpoint.  Will arrange follow-up.  Thank you for involvement in her care.   [facility] 02/19/2020 10:06 AM  ___________________________________________________________  CC: COVID, AKI on CKD  Interval History/Subjective: Patient feels significantly better today.  Denies any significant dysuria, hematuria, nausea, vomiting.  Urine output has been good.   Medications:  Current Facility-Administered Medications  Medication Dose Route Frequency Provider Last Rate Last Admin  . acetaminophen (TYLENOL) tablet 650 mg  650 mg Oral Q6H PRN Patriciaann Clan, DO   650 mg at 02/18/20 8250   Or  . acetaminophen (TYLENOL) suppository 650 mg  650 mg Rectal Q6H PRN Patriciaann Clan, DO      . cephALEXin (KEFLEX) capsule 500  mg  500 mg Oral Q12H Beard, Samantha N, DO      . heparin injection 5,000 Units  5,000 Units Subcutaneous Q8H Beard, Samantha N, DO   5,000 Units at 02/18/20 2144  . influenza vac split quadrivalent PF (FLUARIX) injection 0.5 mL  0.5 mL Intramuscular Prior to discharge Lenoria Chime, MD          Review of Systems: 10 systems reviewed and negative except per interval history/subjective  Physical Exam: Vitals:   02/19/20 0005 02/19/20 0511  BP: (!) 130/91 127/76  Pulse: 78 85  Resp: (!) 21 19  Temp: 98.6 F (37 C) 98.3 F (36.8 C)  SpO2: 100% 100%   No intake/output data recorded.  Intake/Output Summary (Last 24 hours) at 02/19/2020 1006 Last data filed at 02/19/2020 5397 Gross per 24 hour  Intake 3096.91 ml  Output 1 ml  Net 3095.91 ml   Constitutional: well-appearing, no acute distress ENMT: ears and nose without scars or lesions, MMM CV: normal rate, no edema Respiratory: clear to auscultation, normal work of breathing Gastrointestinal: soft, non-tender, no palpable masses or hernias Skin: no visible lesions or rashes Psych: alert, judgement/insight appropriate, appropriate mood and affect   Test Results I personally reviewed new and old clinical labs and radiology tests Lab Results  Component Value Date   NA 138 02/19/2020   K 3.7 02/19/2020   CL 108 02/19/2020   CO2 19 (L) 02/19/2020   BUN 26 (H) 02/19/2020   CREATININE 3.52 (H) 02/19/2020   CALCIUM 7.6 (L) 02/19/2020   ALBUMIN 2.9 (L) 02/18/2020

## 2020-02-19 NOTE — Progress Notes (Signed)
Whitney Bennett to be D/C'd Home per MD order.  Discussed with the patient and all questions fully answered.  VSS, Skin clean, dry and intact without evidence of skin break down, no evidence of skin tears noted. IV catheter discontinued intact. Site without signs and symptoms of complications. Dressing and pressure applied.  An After Visit Summary was printed and given to the patient. Patient received prescription.  D/c education completed with patient including follow up instructions, medication list, d/c activities limitations if indicated, with other d/c instructions as indicated by MD - patient able to verbalize understanding, all questions fully answered.   Patient instructed to return to ED, call 911, or call MD for any changes in condition.   Patient escorted via [employer], and D/C home via private auto.  [name] 02/19/2020 5:42 PM

## 2020-02-20 NOTE — Discharge Summary (Signed)
[facility] Discharge Summary  Patient name: Whitney Bennett Medical record number: [id number] Date of birth: 29-Nov-1985 Age: 34 y.o. Gender: female Date of Admission: 02/17/2020  Date of Discharge: 11/27 Admitting Physician: Delora Fuel, MD  Primary Care Provider: Mack Hook, MD Consultants: Nephrology   Indication for Hospitalization: AKI on CKD   Discharge Diagnoses/Problem List:  Suspected AKI on CKD stage IIIb  COVID-19 infection, mild Possible UTI/pyelonephritis FSGN Normocytic anemia  Disposition: Home  Discharge Condition: Stable  Discharge Exam:  Blood pressure 121/82, pulse 80, temperature 98 F (36.7 C), temperature source Oral, resp. rate 20, height 5\' 6"  (1.676 m), weight 103 kg, SpO2 99 %. 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  Initial work-up showed Covid positive status with clear CXR (fully vaccinated however had not received booster yet), U/a with many bacteria and hematuria (however patient was on menstrual cycle), and creatinine of 3.82.  Previous baseline Cr 1.9, however last value in 09/2018.  CT renal study was collected due to findings above, showed a nonobstructive stone in the left kidney without any hydronephrosis or peri-renal inflammation.  Her home losartan was held and she was given IV fluids and ceftriaxone for possible AKI and UTI/pyelonephritis with resolution of majority of symptoms, however with minimal improvement in creatinine.    Due to this and her history,  nephrology was consulted.  Felt in addition to acute dehydration/possible pyelonephritis, that she may have had a progression of her CKD since it is been quite sometime since it was last checked.  Urine culture showed no growth, however she was transitioned to Keflex BID on HD #2 for a full 7-day course due to presentation and unclear if collected prior to antibiotic initiation. At discharge, she was hemodynamically stable with Cr was 2.52   Issues for Follow Up:  1. Please make sure she follows up with her nephrologist, Dr. Joelyn Oms, for further observation of renal progression.  Her home losartan was held during admission and at discharge, decision to restart through her nephrologist. 2. She was found to be Covid positive, first symptoms on 11/23.  Mild illness with myalgias, congestion, and cough.  No respiratory concerns and remained on room air for stay.  Did not require any remdesivir or steroids.  Recommended quarantine per CDC guidelines, likely through 12/2. 3. Should have U/A repeated to ensure resolution of hematuria when off menstrual cycle (however may have some remaining with FSGN). No renal stone or bladder abnormality on CT.  4. Normocytic anemia (hgb 11) present. Likely related to CKD, but considering obtaining iron panel/ferritin.   Significant Procedures: None   Significant Labs and Imaging:  Recent Labs  Lab 02/17/20 1335 02/18/20 0323  WBC 6.0 5.4  HGB 11.0* 11.0*  HCT 35.2* 34.9*  PLT 219 201   Recent Labs  Lab 02/17/20 1335 02/17/20 1335 02/17/20 1526 02/18/20 0324 02/19/20 0359  NA 134*  --   --  136 138  K 3.7   < >  --  3.6 3.7  CL 107  --   --  107  108  CO2 16*  --   --  18* 19*  GLUCOSE 108*  --   --  98 97  BUN 24*  --   --  23* 26*  CREATININE 3.82*  --   --  3.62* 3.52*  CALCIUM 7.5*  --   --  7.6* 7.6*  ALKPHOS  --   --  79 78  --   AST  --   --  15 15  --   ALT  --   --  14 15  --   ALBUMIN  --   --  3.1* 2.9*  --    < > = values in this interval  not displayed.    DG Chest Portable 1 View  Result Date: 02/17/2020 CLINICAL DATA:  34 year old female with fever and cough. EXAM: PORTABLE CHEST 1 VIEW COMPARISON:  10/20/2015 FINDINGS: The heart size and mediastinal contours are within normal limits. Both lungs are clear. The visualized skeletal structures are unremarkable. IMPRESSION: No active disease. Electronically Signed   By: Ruthann Cancer MD   On: 02/17/2020 14:57   CT Renal Stone Study  Result Date: 02/17/2020 CLINICAL DATA:  Flank pain. Kidney stone suspected. COVID positive. EXAM: CT ABDOMEN AND PELVIS WITHOUT CONTRAST TECHNIQUE: Multidetector CT imaging of the abdomen and pelvis was performed following the standard protocol without IV contrast. COMPARISON:  None. FINDINGS: Lower chest: Lung bases are clear.  Heart size is normal. Hepatobiliary: Liver is unremarkable. Gallbladder is mildly distended without inflammation. The common bile duct is normal. Pancreas: Unremarkable. No pancreatic ductal dilatation or surrounding inflammatory changes. Spleen: The spleen is enlarged, measuring up to 11 cm in coronal dimension. No focal lesion is present. Adrenals/Urinary Tract: A punctate nonobstructing stone is present at the upper pole of the left kidney. No significant right-sided stones are present. No mass lesion is present. No hydronephrosis present. The urinary bladder is mostly collapsed. Stomach/Bowel: Stomach and duodenum are within normal limits. Small bowel is unremarkable. Terminal ileum is within normal limits. The appendix is visualized and normal. The ascending and transverse colon are within normal limits. The descending and sigmoid colon are normal. Vascular/Lymphatic: No significant vascular findings are present. No enlarged abdominal or pelvic lymph nodes. Reproductive: Uterus and bilateral adnexa are unremarkable. Other: No abdominal wall hernia or abnormality. No abdominopelvic ascites. Musculoskeletal: No acute or significant  osseous findings. IMPRESSION: 1. Punctate nonobstructing stone at the upper pole of the left kidney. 2. No other acute or focal lesion to explain the patient's symptoms. 3. Splenomegaly. Electronically Signed   By: San Morelle M.D.   On: 02/17/2020 19:15   Results/Tests Pending at Time of Discharge: None   Discharge Medications:  Allergies as of 02/19/2020      Reactions   Lisinopril Cough      Medication List    STOP taking these medications   losartan 25 MG tablet Commonly known as: COZAAR   sertraline 50 MG tablet Commonly known as: ZOLOFT     TAKE these medications   acetaminophen 500 MG tablet Commonly known as: TYLENOL Take 500-1,000 mg by mouth as needed for mild pain, moderate pain or fever.   cephALEXin 500 MG capsule Commonly known as: KEFLEX Take 1 capsule (500 mg total) by mouth every 12 (twelve) hours for 5 days.       Discharge Instructions: Please refer to Patient Instructions section of EMR for full details.  Patient was counseled important signs and symptoms that should prompt return to medical care,  changes in medications, dietary instructions, activity restrictions, and follow up appointments.   Follow-Up Appointments:  Follow-up Information    Rexene Agent, MD. Schedule an appointment as soon as possible for a visit.   Specialty: Nephrology Contact information: [place] [address] [phone]        Mack Hook, MD. Schedule an appointment as soon as possible for a visit.   Specialty: Internal Medicine Why: for hospital follow up.  Contact information: [place] [address] [phone]               Patriciaann Clan, DO 02/20/2020, 10:18 PM PGY-3, [place]

## 2020-02-22 LAB — CULTURE, BLOOD (ROUTINE X 2)
Culture: NO GROWTH
Culture: NO GROWTH
Special Requests: ADEQUATE
Special Requests: ADEQUATE

## 2020-05-05 ENCOUNTER — Telehealth: Payer: Self-pay | Admitting: Clinical

## 2020-05-09 NOTE — Telephone Encounter (Signed)
I believe the person they plan to donate to would have to be set up with a transplant surgeon and be on the transplant list first.  I can look into this, but do not have an easy answer to this at this time.

## 2020-05-10 NOTE — Telephone Encounter (Signed)
Called Leroy Kennedy and left a message with Doctor's response. I also asked to called back if she has any additional questions.

## 2020-05-25 ENCOUNTER — Other Ambulatory Visit: Payer: Self-pay

## 2020-05-25 ENCOUNTER — Encounter: Payer: Self-pay | Admitting: Internal Medicine

## 2020-05-25 ENCOUNTER — Ambulatory Visit (INDEPENDENT_AMBULATORY_CARE_PROVIDER_SITE_OTHER): Payer: Self-pay | Admitting: Internal Medicine

## 2020-05-25 VITALS — BP 124/86 | HR 71 | Resp 12 | Ht 66.0 in | Wt 219.0 lb

## 2020-05-25 DIAGNOSIS — Z6835 Body mass index (BMI) 35.0-35.9, adult: Secondary | ICD-10-CM

## 2020-05-25 DIAGNOSIS — E782 Mixed hyperlipidemia: Secondary | ICD-10-CM

## 2020-05-25 DIAGNOSIS — N183 Chronic kidney disease, stage 3 unspecified: Secondary | ICD-10-CM

## 2020-05-25 NOTE — Progress Notes (Signed)
    Subjective:    Patient ID: Whitney Bennett, female   DOB: [birth year], 35 y.o.   MRN: [id number]   HPI   1.  CKD:  Acute on chronic renal failure in November when hospitalized for COVID.  Did finally follow up with Renal, Dr. Joelyn Oms, last month.   She has not been restarted on Losartan--stopped when she was in the hospital with dehydration and worsening kidney status. Now on a strict plant based diet and 24 hour protein intake equal or less to 100 g daily. She does note when she takes in too much protein, she has more edema in hands and feet.  States her proteinuria has improved significantly and has had about 15 lb weight loss.  Her glucose has improved. She is not aware of what her cholesterol is running.   They have discussed transplantation down the road. She is not quite ready to consider her planning for this with her immigrant status.   She has a counselor she is working with with her relationship with food and also the guilt she feels for people being willing to donate a kidney.    Has been vaccinated for COVID--plans for booster this weekend.  Received second [place] in March 2021.  States she did get an influenza vaccine before her hospitalization.  Current Meds  Medication Sig   acetaminophen (TYLENOL) 500 MG tablet Take 500-1,000 mg by mouth as needed for mild pain, moderate pain or fever.    Allergies  Allergen Reactions   Lisinopril Cough     Review of Systems    Objective:   BP 124/86 (BP Location: Left Arm, Patient Position: Sitting, Cuff Size: Normal)   Pulse 71   Resp 12   Ht '5\' 6"'$  (1.676 m)   Wt 219 lb (99.3 kg)   LMP 04/28/2020   BMI 35.35 kg/m   Physical Exam  NAD HEENT:  PERRL EOMI,  Neck:  Supple, No adenopathy Chest:  CTA CV:  RRR with normal S1 and S2, No S3, S4 or murmur.  Radial and DP pulses normal and equal. Abd:  S, NT, No HSM or mass, + BS LE:  No edema.   Assessment & Plan   CKD:  as per Renal.   Send for labs from  February with Renal to see if cholesterol already done and renal status.   Follow up in 6 months. Return for pneumococcal 23 when ready after COVID booster.  2.  Obesity:  continues to work on lifestyle changes  3.  Mixed hyperlipidemia:  as in #1

## 2020-05-31 ENCOUNTER — Ambulatory Visit: Payer: Self-pay | Admitting: Internal Medicine

## 2020-08-07 ENCOUNTER — Other Ambulatory Visit: Payer: Self-pay

## 2020-08-07 ENCOUNTER — Encounter: Payer: BLUE CROSS/BLUE SHIELD | Attending: Nephrology | Admitting: Skilled Nursing Facility1

## 2020-08-07 ENCOUNTER — Encounter: Payer: Self-pay | Admitting: Skilled Nursing Facility1

## 2020-08-07 DIAGNOSIS — N184 Chronic kidney disease, stage 4 (severe): Secondary | ICD-10-CM | POA: Diagnosis present

## 2020-08-07 NOTE — Progress Notes (Signed)
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 Pt states she speaks with her therapist every 2 weeks. Pt states plant based makes her feel batter mentally and can finish a soccer game when she feels better.  Pt sates she used to eat high fat foods and lead a sedentary lifestyle.  Pt states she has been finding new ways to connect with her friends and family.  Pt states she has been working on eating smaller portions.   Estimated daily fluid intake: 64 oz Supplements: N/A Sleep:  Stress / self-care: appropriate  Current average weekly physical activity: Soccer  24-Hr Dietary Recall First Meal: apple + tea or green juice: celery, cucumber parsley, green apple sometimes with coconut water Snack:  Second Meal: pita + hummus + eggs + air fried egg rolls or eggs + toast + hashbrowns Snack: sugar free jello Third Meal: coffee + oatmilk+ toast avocado and tofu + tumeric or chicken rice  Snack:  Beverages: green tea, tumeric tea, ginger tea, water   NUTRITION INTERVENTION  Nutrition education (E-1) on the following topics:  . Renal diet . Plant based protein  . Emotional eating  Handouts Provided Include   Detailed Renal MyPlate  Learning Style & Readiness for Change Teaching method utilized: Visual & Auditory  Demonstrated degree of understanding via: Teach Back  Barriers to learning/adherence to lifestyle change: none identified    Goals Established by Pt . Increase physical activity: 30 minutes most days of the week . There are no fruits or vegetables you need to avoid according to your labs being lower end of normal limits  . Eat every 3-5 hours, do not skip meals . continue to work on your mental health to better effect change with emotional eating  . continue to avoid processed meats and pre packaged foods   MONITORING & EVALUATION Dietary intake, weekly physical activity  Next Steps  Patient is to follow up as needed

## 2020-10-27 ENCOUNTER — Other Ambulatory Visit: Payer: Self-pay

## 2020-10-27 ENCOUNTER — Ambulatory Visit (HOSPITAL_COMMUNITY)
Admission: EM | Admit: 2020-10-27 | Discharge: 2020-10-27 | Disposition: A | Discharge status: Home / Self Care | Payer: BLUE CROSS/BLUE SHIELD

## 2020-10-27 ENCOUNTER — Encounter (HOSPITAL_COMMUNITY): Payer: Self-pay | Admitting: *Deleted

## 2020-10-27 DIAGNOSIS — M79604 Pain in right leg: Secondary | ICD-10-CM

## 2020-10-27 NOTE — ED Triage Notes (Signed)
PT is on the Kidney transplant list.

## 2020-10-27 NOTE — ED Triage Notes (Signed)
Pt reports bruise on RT calf with pain to calf. Pt reports she has been sitting a lot with her job and driving .

## 2020-10-27 NOTE — ED Provider Notes (Signed)
[facility]    CSN: [id number] Arrival date & time: 10/27/20  1511      History   Chief Complaint Chief Complaint  Patient presents with   Leg Pain    HPI Whitney Bennett is a 35 y.o. female.   HPI  Leg bruise: Patient reports that yesterday she noticed a bruise of her right lateral upper calf.  She states that this is a bit sore to touch.  No known injury.  She is concerned that she wants to make sure that she does not have a blood clot.  No history of blood clots.  She is not on birth control or and has not had any recent surgeries or procedures.  She does sit often at work.  No chest pain or shortness of breath.  She has not tried anything for symptoms.  Past Medical History:  Diagnosis Date   Anxiety    Chronic kidney disease    with proteinuria    Patient Active Problem List   Diagnosis Date Noted   Fever    COVID-19    Acute renal failure ([place]) 02/17/2020   Mixed hyperlipidemia 08/24/2018   Anxiety and depression 08/24/2018   Chronic kidney disease    Breast tenderness in female 11/14/2015   MONILIAL VAGINITIS 09/20/2008   VISUAL IMPAIRMENT 09/20/2008   HEARING DEFICIT 09/20/2008   ABSCESS, TOOTH 06/10/2008   URINARY TRACT INFECTION, RECURRENT 06/10/2008   OTH D/O MENSTRUATION&OTH ABN BLEED FE GNT TRACT 06/10/2008    Past Surgical History:  Procedure Laterality Date   NO PAST SURGERIES      OB History     Gravida  2   Para  1   Term  1   Preterm      AB  0   Living  2      SAB      IAB      Ectopic  0   Multiple      Live Births  1            Home Medications    Prior to Admission medications   Medication Sig Start Date End Date Taking? Authorizing Provider  acetaminophen (TYLENOL) 500 MG tablet Take 500-1,000 mg by mouth as needed for mild pain, moderate pain or fever.     [provider]    Family History Family History  Problem Relation Age of Onset   Diabetes Mother    Thyroid disease  Mother    Cervical polyp Mother    Post-traumatic stress disorder Mother    Hypercholesterolemia Father    Depression Father    Hypertension Father    Heart disease Brother    Diabetes Maternal Grandmother    Diabetes Maternal Grandfather    Stroke Maternal Grandfather    Diabetes Paternal Grandmother    Alcohol abuse Paternal Grandfather    Alcoholism Paternal Grandfather    Other Neg Hx     Social History Social History   Tobacco Use   Smoking status: Never   Smokeless tobacco: Never  Substance Use Topics   Alcohol use: Yes    Alcohol/week: 1.0 - 2.0 standard drink    Types: 1 - 2 Standard drinks or equivalent per week    Comment: quit 2019.  Previously occasionally   Drug use: No     Allergies   Lisinopril   Review of Systems Review of Systems  As stated above in HPI Physical Exam Triage Vital Signs ED Triage Vitals [10/27/20  1527]  Enc Vitals Group     BP 123/78     Pulse Rate 100     Resp 18     Temp 98.1 F (36.7 C)     Temp src      SpO2 100 %     Weight      Height      Head Circumference      Peak Flow      Pain Score 4     Pain Loc      Pain Edu?      Excl. in [place]?    No data found.  Updated Vital Signs BP 123/78   Pulse 100   Temp 98.1 F (36.7 C)   Resp 18   LMP 10/22/2020   SpO2 100%   Physical Exam Vitals and nursing note reviewed.  Constitutional:      General: She is not in acute distress.    Appearance: Normal appearance. She is obese. She is not ill-appearing, toxic-appearing or diaphoretic.  Cardiovascular:     Pulses: Normal pulses.  Musculoskeletal:        General: No swelling.     Comments: Calf circumference 17-1/4 inches bilaterally. Negative Homan's sign bilaterally   Skin:    General: Skin is warm.     Capillary Refill: Capillary refill takes less than 2 seconds.     Coloration: Skin is not jaundiced or pale.     Findings: Bruising (lateral right leg) present.  Neurological:     Mental Status: She is alert.      Sensory: No sensory deficit.     Motor: No weakness.     Coordination: Coordination normal.     UC Treatments / Results  Labs (all labs ordered are listed, but only abnormal results are displayed) Labs Reviewed - No data to display  EKG   Radiology No results found.  Procedures Procedures (including critical care time)  Medications Ordered in UC Medications - No data to display  Initial Impression / Assessment and Plan / UC Course  I have reviewed the triage vital signs and the nursing notes.  Pertinent labs & imaging results that were available during my care of the patient were reviewed by me and considered in my medical decision making (see chart for details).     New.  Wells criteria -2 points (no active cancer, she has not been bedridden or had major surgery or procedure within 12 weeks, no calf swelling greater than 3 cm, no superficial veins present, no swelling of the entire leg, no tenderness along the deep vein system, no pitting edema, no paralysis or immobility, no history of past DVT or clot).  I have recommended warm compresses, a baby aspirin x1 day and 24-hour monitoring.  Should symptoms worsen at all or should she wish confirmation a doppler US recommended. She elects to hold off on doppler at this time.    Final Clinical Impressions(s) / UC Diagnoses   Final diagnoses:  None   Discharge Instructions   None    ED Prescriptions   None    PDMP not reviewed this encounter.   Hughie Closs, [place] 10/27/20 1623

## 2020-11-24 ENCOUNTER — Encounter: Payer: Self-pay | Admitting: Internal Medicine

## 2020-11-24 ENCOUNTER — Other Ambulatory Visit: Payer: Self-pay

## 2020-11-24 ENCOUNTER — Ambulatory Visit: Payer: BLUE CROSS/BLUE SHIELD | Admitting: Internal Medicine

## 2020-11-24 VITALS — BP 128/90 | HR 84 | Resp 12 | Ht 66.0 in | Wt 217.5 lb

## 2020-11-24 DIAGNOSIS — E782 Mixed hyperlipidemia: Secondary | ICD-10-CM | POA: Diagnosis not present

## 2020-11-24 DIAGNOSIS — F321 Major depressive disorder, single episode, moderate: Secondary | ICD-10-CM | POA: Diagnosis not present

## 2020-11-24 DIAGNOSIS — N184 Chronic kidney disease, stage 4 (severe): Secondary | ICD-10-CM

## 2020-11-24 MED ORDER — BUPROPION HCL ER (SR) 100 MG PO TB12: ORAL_TABLET | ORAL | 3 refills | Status: DC

## 2020-11-24 NOTE — Progress Notes (Signed)
    Subjective:    Patient ID: Whitney Bennett, female   DOB: [birth year], 35 y.o.   MRN: [id number]   HPI    CKD Stage 4:  reportedly down to GFR of 15.  She is working with [facility] on getting listed for transplant.  Discussing with Renal will likely need to start dialysis when down to GFR of 11.  Plans to speak with Dr. Joelyn Oms, renal, regarding use SGLT2 for maintaining kidney function.    2.  Hyperlipidemia:  discussed labs from [facility] performed 09/12/20:  total 222, LDL 140; Trigs:  237; and HDL 44.  She is actively working on diet and physical activity.  Noted also her A1C tested on 09/12/20 was normal at 5.5%.  3.  Obesity:  10 lbs down from November.  4.  Depression:  has not been able to find a consistent therapist.  She is recognizing significant issues with depression at this point and feels she also needs medication.  Denies suicidal ideation, but would be okay if just did not awaken in morning.  Difficulties with her health and relationship with husband.  Poor energy--forces herself to get up and get going, but could stay in PJs in bed all day. No history of seizure activity.  Current Meds  Medication Sig   Cholecalciferol 25 MCG (1000 UT) tablet Take by mouth.   Ferrous Sulfate 27 MG TABS Take by mouth daily.     Allergies  Allergen Reactions   Lisinopril Cough     Review of Systems    Objective:   BP 128/90 (BP Location: Left Arm, Patient Position: Sitting, Cuff Size: Normal)   Pulse 84   Resp 12   Ht '5\' 6"'$  (1.676 m)   Wt 217 lb 8 oz (98.7 kg)   BMI 35.11 kg/m   Physical Exam NAD Lungs:  CTA CV:  RRR without murmur or rub. LE:  No edema.   Assessment & Plan   Depression:  Call into retired Engineer, water for recommendations for counseling.   Start Bupropion SR 100 mg once every 72 hours with her current renal function.  Call if any problems.  Follow up in 1 week--can be virtual.   If all is well at 1 week, will have her come in at 6 weeks.  2.   CKD:  as per renal.  Discussed she should discuss SGLT2 with Dr. Joelyn Oms.    3.  Hyperlipidemia/obesity:  Continues to work on lifestyle changes.  Is losing weight.

## 2020-12-03 DIAGNOSIS — F321 Major depressive disorder, single episode, moderate: Secondary | ICD-10-CM | POA: Insufficient documentation

## 2020-12-04 ENCOUNTER — Other Ambulatory Visit: Payer: Self-pay

## 2020-12-04 ENCOUNTER — Telehealth (INDEPENDENT_AMBULATORY_CARE_PROVIDER_SITE_OTHER): Payer: BLUE CROSS/BLUE SHIELD | Admitting: Internal Medicine

## 2020-12-04 ENCOUNTER — Encounter: Payer: Self-pay | Admitting: Internal Medicine

## 2020-12-04 DIAGNOSIS — F32A Depression, unspecified: Secondary | ICD-10-CM

## 2020-12-04 DIAGNOSIS — F419 Anxiety disorder, unspecified: Secondary | ICD-10-CM

## 2020-12-04 DIAGNOSIS — N184 Chronic kidney disease, stage 4 (severe): Secondary | ICD-10-CM

## 2020-12-04 NOTE — Progress Notes (Signed)
    Subjective:    Patient ID: Whitney Bennett, female   DOB: October 22, 1985, 35 y.o.   MRN: [id number]   HPI  Virtual visit  She is taking the bupropion ER 100 mg once every 72 hours.  Tolerating well.  Sleeping a bit better.  No suicidal thoughts. She has not heard from Dr. Nira Conn Bennett's office.  Not sure if the referral has been formally sent yet.  She is waiting to hear from the Transplant team as to whether she will be listed for organ donation with her CKD.  Current Meds  Medication Sig   buPROPion ER (WELLBUTRIN SR) 100 MG 12 hr tablet 1 tab by mouth every 72 hours.   Cholecalciferol 25 MCG (1000 UT) tablet Take by mouth.   Ferrous Sulfate 27 MG TABS Take by mouth daily.   Allergies  Allergen Reactions   Lisinopril Cough     Review of Systems    Objective:   There were no vitals taken for this visit.  Physical Exam Looks happy and well, though a bit tearful at times  Assessment & Plan    Depression:  increase Bupropion XR to 100 mg every 48 hours.  She will call in progress report next Monday and we can decide about increasing to 100 mg every 24 hours.  Max is 150 mg every 24 hours with her CrCl.  Gave her Dr. Nonnie Bennett contact information and she will proactively call the office to get set up with counseling.  Also considering Whitney Clines, LCSW.  2.  CKD:  await information from transplant group

## 2021-01-05 ENCOUNTER — Telehealth: Payer: BLUE CROSS/BLUE SHIELD | Admitting: Internal Medicine

## 2021-01-12 ENCOUNTER — Telehealth (INDEPENDENT_AMBULATORY_CARE_PROVIDER_SITE_OTHER): Payer: BLUE CROSS/BLUE SHIELD | Admitting: Internal Medicine

## 2021-01-12 DIAGNOSIS — F32A Depression, unspecified: Secondary | ICD-10-CM

## 2021-01-12 DIAGNOSIS — F419 Anxiety disorder, unspecified: Secondary | ICD-10-CM

## 2021-01-12 MED ORDER — BUPROPION HCL ER (SR) 100 MG PO TB12: ORAL_TABLET | ORAL | 11 refills | Status: DC

## 2021-01-12 NOTE — Progress Notes (Signed)
    Subjective:    Patient ID: Whitney Bennett, female   DOB: 04-21-1985, 35 y.o.   MRN: [id number]   HPI  Depression:  has been afraid to increase the bupropion more frequently than every 72 hours.  She was having a bit more anxiety as she is working on traumatic issues with the counseling.  She is working with Jonelle Sidle at [employer] and seeing her once weekly.  Things are coming up she did not expect.  They are planning to work with EMDR soon.   Only has crying spells now when in therapy. Therapist feels she is definitely depressed, Endy just not recognizing her symptoms and signs of depression.  She still does not feel the way she feels she should  She is now on the kidney transplant list.  Feels frustrated as she feels privileged to be on the list.  Shouldn't have to feel this way when she just wants good health.    Better with stress management part.    Current Meds  Medication Sig   buPROPion ER (WELLBUTRIN SR) 100 MG 12 hr tablet 1 tab by mouth every 72 hours.   Cholecalciferol 25 MCG (1000 UT) tablet Take by mouth.   Ferrous Sulfate 27 MG TABS Take by mouth daily.   Allergies  Allergen Reactions   Lisinopril Cough     Review of Systems    Objective:   There were no vitals taken for this visit.  Physical Exam Looks well and happy   Assessment & Plan   Depression/Trauma:  change to every 48 hour dosing with Bupropion ER 100 mg.  For her CreaCl, would not go above 150 mg every 24 hours. Follow up in 6 weeks.

## 2021-03-20 ENCOUNTER — Other Ambulatory Visit: Payer: Self-pay

## 2021-03-20 ENCOUNTER — Encounter: Payer: Self-pay | Admitting: Vascular Surgery

## 2021-03-20 ENCOUNTER — Ambulatory Visit: Payer: BLUE CROSS/BLUE SHIELD | Admitting: Vascular Surgery

## 2021-03-20 VITALS — BP 137/93 | HR 73 | Temp 98.6°F | Resp 14 | Ht 66.0 in | Wt 220.0 lb

## 2021-03-20 DIAGNOSIS — N184 Chronic kidney disease, stage 4 (severe): Secondary | ICD-10-CM | POA: Diagnosis not present

## 2021-03-20 NOTE — Progress Notes (Signed)
Patient name: Whitney Bennett MRN: [id number] DOB: 1985/08/01 Sex: female  REASON FOR CONSULT: Evaluate PD catheter placement  HPI: Whitney Bennett is a 35 y.o. 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  Patient states she has spoken with her nephrologist Dr. Joelyn Oms and given her active lifestyle would like to consider PD catheter.  She has had no previous abdominal surgeries.  Has not started dialysis at this time.  States she was told she had 11% kidney function.  She has 2 children with no plans for further pregnancies at this time.  Past Medical History:  Diagnosis Date   Anxiety    Chronic kidney disease    with proteinuria    Past Surgical History:  Procedure Laterality Date   NO PAST SURGERIES      Family History  Problem Relation Age of Onset   Diabetes Mother    Thyroid disease Mother    Cervical polyp Mother    Post-traumatic stress disorder Mother    Hypercholesterolemia Father    Depression Father    Hypertension Father    Heart disease Brother    Diabetes Maternal Grandmother    Diabetes Maternal Grandfather    Stroke Maternal Grandfather    Diabetes Paternal Grandmother    Alcohol abuse Paternal Grandfather    Alcoholism Paternal Grandfather    Other Neg Hx     SOCIAL HISTORY: Social History   Socioeconomic History   Marital status: Married    Spouse name: Not on file   Number of children: 2   Years of education: 14   Highest education level: Not on file  Occupational History   Occupation: Sports coach office    Comment: senior paralegal  Tobacco Use   Smoking status: Never   Smokeless tobacco: Never  Substance and Sexual Activity   Alcohol use: Yes    Alcohol/week: 1.0 - 2.0 standard drink    Types: 1 - 2 Standard drinks or equivalent per week    Comment: quit 2019.  Previously occasionally   Drug use: No   Sexual activity: Yes    Partners: Male     Birth control/protection: Condom  Other Topics Concern   Not on file  Social History Narrative   Lives at home with husband and 2 daughters.  2 cats, a dog and her father.   Social Determinants of Health   Financial Resource Strain: Not on file  Food Insecurity: Not on file  Transportation Needs: Not on file  Physical Activity: Not on file  Stress: Not on file  Social Connections: Not on file  Intimate Partner Violence: Not on file    Allergies  Allergen Reactions   Lisinopril Cough    Current Outpatient Medications  Medication Sig Dispense Refill   buPROPion ER (WELLBUTRIN SR) 100 MG 12 hr tablet 1 tab by mouth every 48 hours. 15 tablet 11   Cholecalciferol 25 MCG (1000 UT) tablet Take by mouth.     Ferrous Sulfate 27 MG TABS Take by mouth daily.     acetaminophen (TYLENOL) 500 MG tablet Take 500-1,000 mg by mouth as needed for mild pain, moderate pain or fever.  (Patient not taking: Reported on 11/24/2020)     No current facility-administered medications for this visit.    REVIEW OF SYSTEMS:  [X]  denotes positive finding, [ ]  denotes negative finding Cardiac  Comments:  Chest pain or chest pressure:  Shortness of breath upon exertion:    Short of breath when lying flat:    Irregular heart rhythm:        Vascular    Pain in calf, thigh, or hip brought on by ambulation:    Pain in feet at night that wakes you up from your sleep:     Blood clot in your veins:    Leg swelling:         Pulmonary    Oxygen at home:    Productive cough:     Wheezing:         Neurologic    Sudden weakness in arms or legs:     Sudden numbness in arms or legs:     Sudden onset of difficulty speaking or slurred speech:    Temporary loss of vision in one eye:     Problems with dizziness:         Gastrointestinal    Blood in stool:     Vomited blood:         Genitourinary    Burning when urinating:     Blood in urine:        Psychiatric    Major depression:          Hematologic    Bleeding problems:    Problems with blood clotting too easily:        Skin    Rashes or ulcers:        Constitutional    Fever or chills:      PHYSICAL EXAM: Vitals:   03/20/21 1351  BP: (!) 137/93  Pulse: 73  Resp: 14  Temp: 98.6 F (37 C)  TempSrc: Temporal  SpO2: 98%  Weight: 220 lb (99.8 kg)  Height: 5\' 6"  (1.676 m)    GENERAL: The patient is a well-nourished female, in no acute distress. The vital signs are documented above. CARDIAC: There is a regular rate and rhythm.  VASCULAR:  Palpable femoral pulses bilaterally PULMONARY: No respiratory distress ABDOMEN: Soft and non-tender MUSCULOSKELETAL: There are no major deformities or cyanosis. NEUROLOGIC: No focal weakness or paresthesias are detected. SKIN: There are no ulcers or rashes noted. PSYCHIATRIC: The patient has a normal affect.  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  She has no previous abdominal surgery and I think she would be a good candidate for PD catheter if she decides to proceed.  I discussed basic steps of the operation as well as placement of the peritoneal catheter in the peritoneal cavity.  Discussed risk and benefits including risk of injury to the abdominal structures including catheter kinking, getting infected, etc.  Ultimately offered to schedule her today but she is going to discuss with Dr. Joelyn Oms and her nephrology office best timing to proceed with scheduling.   [name], MD [facility] Office: [phone]

## 2021-03-22 ENCOUNTER — Other Ambulatory Visit: Payer: Self-pay

## 2021-03-22 DIAGNOSIS — N184 Chronic kidney disease, stage 4 (severe): Secondary | ICD-10-CM

## 2021-04-17 ENCOUNTER — Other Ambulatory Visit: Payer: Self-pay

## 2021-04-17 ENCOUNTER — Encounter (HOSPITAL_COMMUNITY): Payer: Self-pay | Admitting: Vascular Surgery

## 2021-04-17 NOTE — Addendum Note (Signed)
Addended by: Nicholas Lose on: 04/17/2021 09:47 AM   Modules accepted: Orders

## 2021-04-17 NOTE — Progress Notes (Addendum)
Reason for procedure- needs to be changed- not for hemodialysis - access. I ieft vm on nurse line.  Kia  changed the consent wording.  Whitney Bennett and I played phone tag today, we set an appointment for 1815 to talk.  Patient denies chest pain or shortness of breath. Patient denies having any s/s of Covid in her household.  Patient denies any known exposure to Covid.   Whitney Bennett states that she has anxiety and panics at times and with it she occasionally has chest discomfort.  Patient        states that PCP from [facility] and Dr Joelyn Oms is aware, PCP feels that the discomfort is from anxiety, that is when she notices it.  I instructed Whitney Bennett to shower with antibiotic soap, if it is available.  Dry off with a clean towel. Do not put lotion, powder, cologne or deodorant or makeup.No jewelry or piercings. Men may shave their face and neck. Woman should not shave. No nail polish, artificial or acrylic nails. Wear clean clothes, brush your teeth. Glasses, contact lens,dentures or partials may not be worn in the OR. If you need to wear them, please bring a case for glasses, do not wear contacts or bring a case, the hospital does not have contact cases, dentures or partials will have to be removed , make sure they are clean, we will provide a denture cup to put them in. You will need some one to drive you home and a responsible person over the age of 4 to stay with you for the first 24 hours after surgery.

## 2021-04-18 ENCOUNTER — Other Ambulatory Visit: Payer: Self-pay

## 2021-04-18 ENCOUNTER — Ambulatory Visit (HOSPITAL_COMMUNITY)
Admission: RE | Admit: 2021-04-18 | Discharge: 2021-04-18 | Disposition: A | Discharge status: Home / Self Care | Payer: BLUE CROSS/BLUE SHIELD | Attending: Vascular Surgery | Admitting: Vascular Surgery

## 2021-04-18 ENCOUNTER — Encounter (HOSPITAL_COMMUNITY)
Admission: RE | Disposition: A | Discharge status: Home / Self Care | Payer: Self-pay | Source: Home / Self Care | Attending: Vascular Surgery

## 2021-04-18 ENCOUNTER — Encounter (HOSPITAL_COMMUNITY): Payer: Self-pay | Admitting: Vascular Surgery

## 2021-04-18 ENCOUNTER — Ambulatory Visit (HOSPITAL_COMMUNITY): Payer: BLUE CROSS/BLUE SHIELD | Admitting: Anesthesiology

## 2021-04-18 DIAGNOSIS — Z79899 Other long term (current) drug therapy: Secondary | ICD-10-CM | POA: Insufficient documentation

## 2021-04-18 DIAGNOSIS — F419 Anxiety disorder, unspecified: Secondary | ICD-10-CM | POA: Insufficient documentation

## 2021-04-18 DIAGNOSIS — N185 Chronic kidney disease, stage 5: Secondary | ICD-10-CM | POA: Diagnosis not present

## 2021-04-18 DIAGNOSIS — N184 Chronic kidney disease, stage 4 (severe): Secondary | ICD-10-CM

## 2021-04-18 DIAGNOSIS — N186 End stage renal disease: Secondary | ICD-10-CM | POA: Insufficient documentation

## 2021-04-18 HISTORY — DX: Depression, unspecified: F32.A

## 2021-04-18 HISTORY — PX: CAPD INSERTION: SHX5233

## 2021-04-18 LAB — POCT I-STAT, CHEM 8
BUN: 44 mg/dL — ABNORMAL HIGH (ref 6–20)
Calcium, Ion: 0.94 mmol/L — ABNORMAL LOW (ref 1.15–1.40)
Chloride: 111 mmol/L (ref 98–111)
Creatinine, Ser: 6.4 mg/dL — ABNORMAL HIGH (ref 0.44–1.00)
Glucose, Bld: 77 mg/dL (ref 70–99)
HCT: 34 % — ABNORMAL LOW (ref 36.0–46.0)
Hemoglobin: 11.6 g/dL — ABNORMAL LOW (ref 12.0–15.0)
Potassium: 3.7 mmol/L (ref 3.5–5.1)
Sodium: 140 mmol/L (ref 135–145)
TCO2: 19 mmol/L — ABNORMAL LOW (ref 22–32)

## 2021-04-18 LAB — POCT PREGNANCY, URINE: Preg Test, Ur: NEGATIVE

## 2021-04-18 SURGERY — LAPAROSCOPIC INSERTION CONTINUOUS AMBULATORY PERITONEAL DIALYSIS  (CAPD) CATHETER
Anesthesia: General | Site: Abdomen

## 2021-04-18 MED ORDER — DEXAMETHASONE SODIUM PHOSPHATE 10 MG/ML IJ SOLN
INTRAMUSCULAR | Status: DC | PRN
  Administered 2021-04-18: 10 mg via INTRAVENOUS

## 2021-04-18 MED ORDER — ORAL CARE MOUTH RINSE: 15.0000 mL | Freq: Once | OROMUCOSAL | Status: AC

## 2021-04-18 MED ORDER — ROCURONIUM BROMIDE 10 MG/ML (PF) SYRINGE
PREFILLED_SYRINGE | INTRAVENOUS | Status: DC | PRN
Start: 2021-04-18 — End: 2021-04-18
  Administered 2021-04-18: 60 mg via INTRAVENOUS

## 2021-04-18 MED ORDER — SODIUM CHLORIDE 0.9 % IR SOLN
Status: DC | PRN
  Administered 2021-04-18: 1000 mL

## 2021-04-18 MED ORDER — SODIUM CHLORIDE 0.9 % IV SOLN: INTRAVENOUS | Status: DC

## 2021-04-18 MED ORDER — FENTANYL CITRATE (PF) 250 MCG/5ML IJ SOLN
INTRAMUSCULAR | Status: DC | PRN
  Administered 2021-04-18 (×4): 50 ug via INTRAVENOUS

## 2021-04-18 MED ORDER — FENTANYL CITRATE (PF) 100 MCG/2ML IJ SOLN
25.0000 ug | INTRAMUSCULAR | Status: DC | PRN
  Administered 2021-04-18: 12:00:00 50 ug via INTRAVENOUS

## 2021-04-18 MED ORDER — MIDAZOLAM HCL 2 MG/2ML IJ SOLN
INTRAMUSCULAR | Status: DC | PRN
Start: 2021-04-18 — End: 2021-04-18
  Administered 2021-04-18: 1 mg via INTRAVENOUS

## 2021-04-18 MED ORDER — PHENYLEPHRINE HCL-NACL 20-0.9 MG/250ML-% IV SOLN
INTRAVENOUS | Status: DC | PRN
  Administered 2021-04-18: 20 ug/min via INTRAVENOUS

## 2021-04-18 MED ORDER — CHLORHEXIDINE GLUCONATE 4 % EX LIQD: 60.0000 mL | Freq: Once | CUTANEOUS | Status: DC

## 2021-04-18 MED ORDER — FENTANYL CITRATE (PF) 100 MCG/2ML IJ SOLN
INTRAMUSCULAR | Status: AC
  Filled 2021-04-18: qty 2

## 2021-04-18 MED ORDER — PHENYLEPHRINE 40 MCG/ML (10ML) SYRINGE FOR IV PUSH (FOR BLOOD PRESSURE SUPPORT)
PREFILLED_SYRINGE | INTRAVENOUS | Status: DC | PRN
Start: 2021-04-18 — End: 2021-04-18
  Administered 2021-04-18: 80 ug via INTRAVENOUS

## 2021-04-18 MED ORDER — PROPOFOL 10 MG/ML IV BOLUS
INTRAVENOUS | Status: AC
  Filled 2021-04-18: qty 20

## 2021-04-18 MED ORDER — ONDANSETRON HCL 4 MG/2ML IJ SOLN
INTRAMUSCULAR | Status: DC | PRN
  Administered 2021-04-18: 4 mg via INTRAVENOUS

## 2021-04-18 MED ORDER — CEFAZOLIN SODIUM-DEXTROSE 2-4 GM/100ML-% IV SOLN
2.0000 g | INTRAVENOUS | Status: AC
  Administered 2021-04-18: 11:00:00 2 g via INTRAVENOUS
  Filled 2021-04-18: qty 100

## 2021-04-18 MED ORDER — CHLORHEXIDINE GLUCONATE 0.12 % MT SOLN
15.0000 mL | Freq: Once | OROMUCOSAL | Status: AC
  Administered 2021-04-18: 09:00:00 15 mL via OROMUCOSAL
  Filled 2021-04-18: qty 15

## 2021-04-18 MED ORDER — HEPARIN 6000 UNIT IRRIGATION SOLUTION
Status: DC | PRN
  Administered 2021-04-18: 1

## 2021-04-18 MED ORDER — FENTANYL CITRATE (PF) 250 MCG/5ML IJ SOLN
INTRAMUSCULAR | Status: AC
  Filled 2021-04-18: qty 5

## 2021-04-18 MED ORDER — LIDOCAINE 2% (20 MG/ML) 5 ML SYRINGE
INTRAMUSCULAR | Status: DC | PRN
  Administered 2021-04-18: 80 mg via INTRAVENOUS

## 2021-04-18 MED ORDER — OXYCODONE-ACETAMINOPHEN 5-325 MG PO TABS: 1.0000 | ORAL_TABLET | Freq: Four times a day (QID) | ORAL | 0 refills | Status: DC | PRN

## 2021-04-18 MED ORDER — MIDAZOLAM HCL 2 MG/2ML IJ SOLN
INTRAMUSCULAR | Status: AC
  Filled 2021-04-18: qty 2

## 2021-04-18 MED ORDER — PROPOFOL 10 MG/ML IV BOLUS
INTRAVENOUS | Status: DC | PRN
  Administered 2021-04-18: 150 mg via INTRAVENOUS

## 2021-04-18 MED ORDER — SUGAMMADEX SODIUM 200 MG/2ML IV SOLN
INTRAVENOUS | Status: DC | PRN
Start: 2021-04-18 — End: 2021-04-18
  Administered 2021-04-18: 200 mg via INTRAVENOUS

## 2021-04-18 MED ORDER — HEPARIN 6000 UNIT IRRIGATION SOLUTION
Status: AC
  Filled 2021-04-18: qty 500

## 2021-04-18 SURGICAL SUPPLY — 59 items
ADAPTER TITANIUM MEDIONICS (MISCELLANEOUS) ×3 IMPLANT
ADH SKN CLS APL DERMABOND .7 (GAUZE/BANDAGES/DRESSINGS) ×1
ADPR DLYS CATH STRL LF DISP (MISCELLANEOUS) ×1
APL PRP STRL LF DISP 70% ISPRP (MISCELLANEOUS) ×1
BAG DECANTER FOR FLEXI CONT (MISCELLANEOUS) ×3 IMPLANT
BIOPATCH RED 1 DISK 7.0 (GAUZE/BANDAGES/DRESSINGS) ×3 IMPLANT
BLADE 11 SAFETY STRL DISP (BLADE) ×3 IMPLANT
BLADE CLIPPER SURG (BLADE) IMPLANT
CATH EXTENDED DIALYSIS (CATHETERS) ×3 IMPLANT
CHLORAPREP W/TINT 26 (MISCELLANEOUS) ×3 IMPLANT
COVER SURGICAL LIGHT HANDLE (MISCELLANEOUS) ×3 IMPLANT
DECANTER SPIKE VIAL GLASS SM (MISCELLANEOUS) ×3 IMPLANT
DERMABOND ADVANCED (GAUZE/BANDAGES/DRESSINGS) ×1
DERMABOND ADVANCED .7 DNX12 (GAUZE/BANDAGES/DRESSINGS) ×2 IMPLANT
DRSG IV TEGADERM 3.5X4.5 STRL (GAUZE/BANDAGES/DRESSINGS) ×1 IMPLANT
ELECT REM PT RETURN 9FT ADLT (ELECTROSURGICAL) ×2
ELECTRODE REM PT RTRN 9FT ADLT (ELECTROSURGICAL) ×2 IMPLANT
GAUZE 4X4 16PLY ~~LOC~~+RFID DBL (SPONGE) ×1 IMPLANT
GAUZE SPONGE 4X4 12PLY STRL (GAUZE/BANDAGES/DRESSINGS) ×3 IMPLANT
GAUZE SPONGE 4X4 12PLY STRL LF (GAUZE/BANDAGES/DRESSINGS) ×1 IMPLANT
GLOVE SRG 8 PF TXTR STRL LF DI (GLOVE) IMPLANT
GLOVE SURG POLY ORTHO LF SZ7.5 (GLOVE) ×1 IMPLANT
GLOVE SURG POLYISO LF SZ8 (GLOVE) ×3 IMPLANT
GLOVE SURG SYN 8.0 (GLOVE) ×2 IMPLANT
GLOVE SURG SYN 8.0 PF PI (GLOVE) IMPLANT
GLOVE SURG UNDER POLY LF SZ8 (GLOVE) ×2
GOWN STRL REUS W/ TWL LRG LVL3 (GOWN DISPOSABLE) ×6 IMPLANT
GOWN STRL REUS W/ TWL XL LVL3 (GOWN DISPOSABLE) ×2 IMPLANT
GOWN STRL REUS W/TWL LRG LVL3 (GOWN DISPOSABLE) ×6
GOWN STRL REUS W/TWL XL LVL3 (GOWN DISPOSABLE) ×2
GRASPER SUT TROCAR 14GX15 (MISCELLANEOUS) ×3 IMPLANT
IRRIG SUCT STRYKERFLOW 2 WTIP (MISCELLANEOUS)
IRRIGATION SUCT STRKRFLW 2 WTP (MISCELLANEOUS) IMPLANT
IV NS 1000ML (IV SOLUTION) ×2
IV NS 1000ML BAXH (IV SOLUTION) ×2 IMPLANT
KIT BASIN OR (CUSTOM PROCEDURE TRAY) ×3 IMPLANT
KIT TURNOVER KIT B (KITS) ×3 IMPLANT
NDL INSUFFLATION 14GA 120MM (NEEDLE) ×2 IMPLANT
NEEDLE INSUFFLATION 14GA 120MM (NEEDLE) ×2 IMPLANT
NS IRRIG 1000ML POUR BTL (IV SOLUTION) ×3 IMPLANT
PAD ARMBOARD 7.5X6 YLW CONV (MISCELLANEOUS) ×6 IMPLANT
SCISSORS LAP 5X35 DISP (ENDOMECHANICALS) IMPLANT
SET CYSTO W/LG BORE CLAMP LF (SET/KITS/TRAYS/PACK) ×3 IMPLANT
SET TUBE SMOKE EVAC HIGH FLOW (TUBING) ×3 IMPLANT
SLEEVE ENDOPATH XCEL 5M (ENDOMECHANICALS) ×3 IMPLANT
STYLET FALLER (MISCELLANEOUS) ×3 IMPLANT
SUT MNCRL AB 4-0 PS2 18 (SUTURE) ×6 IMPLANT
SUT PROLENE 0 SH 30 (SUTURE) ×6 IMPLANT
SUT PROLENE 3 0 RB 1 (SUTURE) ×1 IMPLANT
SUT VIC AB 3-0 SH 27 (SUTURE)
SUT VIC AB 3-0 SH 27X BRD (SUTURE) IMPLANT
SYR 20CC LL (SYRINGE) ×3 IMPLANT
TAPE CLOTH SURG 4X10 WHT LF (GAUZE/BANDAGES/DRESSINGS) ×3 IMPLANT
TOWEL GREEN STERILE (TOWEL DISPOSABLE) ×3 IMPLANT
TOWEL GREEN STERILE FF (TOWEL DISPOSABLE) ×3 IMPLANT
TRAY LAPAROSCOPIC MC (CUSTOM PROCEDURE TRAY) ×3 IMPLANT
TROCAR 5MMX150MM (TROCAR) ×3 IMPLANT
TROCAR XCEL NON-BLD 5MMX100MML (ENDOMECHANICALS) ×3 IMPLANT
WATER STERILE IRR 1000ML POUR (IV SOLUTION) ×3 IMPLANT

## 2021-04-18 NOTE — Transfer of Care (Signed)
Immediate Anesthesia Transfer of Care Note  Patient: Whitney Bennett  Procedure(s) Performed: LAPAROSCOPIC INSERTION CONTINUOUS AMBULATORY PERITONEAL DIALYSIS  (CAPD) CATHETER WITH OMENTOPEXY (Abdomen)  Patient Location: PACU  Anesthesia Type:General  Level of Consciousness: awake and alert   Airway & Oxygen Therapy: Patient Spontanous Breathing and Patient connected to face mask oxygen  Post-op Assessment: Report given to RN and Post -op Vital signs reviewed and stable  Post vital signs: Reviewed and stable  Last Vitals:  Vitals Value Taken Time  BP 141/97 04/18/21 1151  Temp    Pulse 86 04/18/21 1154  Resp 19 04/18/21 1154  SpO2 100 % 04/18/21 1154  Vitals shown include unvalidated device data.  Last Pain:  Vitals:   04/18/21 0830  TempSrc:   PainSc: 0-No pain         Complications: No notable events documented.

## 2021-04-18 NOTE — Anesthesia Procedure Notes (Signed)
Procedure Name: Intubation Date/Time: 04/18/2021 10:42 AM Performed by: Lorie Phenix, CRNA Pre-anesthesia Checklist: Patient identified, Emergency Drugs available, Suction available and Patient being monitored Patient Re-evaluated:Patient Re-evaluated prior to induction Oxygen Delivery Method: Circle system utilized Preoxygenation: Pre-oxygenation with 100% oxygen Induction Type: IV induction Ventilation: Mask ventilation without difficulty and Oral airway inserted - appropriate to patient size Laryngoscope Size: Mac and 3 Grade View: Grade I Tube type: Oral Number of attempts: 1 Airway Equipment and Method: Stylet Placement Confirmation: ETT inserted through vocal cords under direct vision, positive ETCO2 and breath sounds checked- equal and bilateral Secured at: 22 cm Tube secured with: Tape Dental Injury: Teeth and Oropharynx as per pre-operative assessment

## 2021-04-18 NOTE — Op Note (Signed)
DATE OF SERVICE: 04/18/2021  PATIENT:  Whitney Bennett  36 y.o. female  PRE-OPERATIVE DIAGNOSIS:  CKD V  POST-OPERATIVE DIAGNOSIS:  Same  PROCEDURE:   1) laparoscopic omentopexy 2) laparoscopic peritoneal dialysis catheter placement  SURGEON:  Surgeon(s) and Role:    * Cherre Robins, MD - Primary  ASSISTANT: [name], MD  An assistant was required to facilitate exposure and expedite the case.  ANESTHESIA:   general  EBL: minimal  BLOOD ADMINISTERED:none  DRAINS:  PD catheter    LOCAL MEDICATIONS USED:  NONE  SPECIMEN:  none  COUNTS: confirmed correct.  TOURNIQUET:  none  PATIENT DISPOSITION:  PACU - hemodynamically stable.   Delay start of Pharmacological VTE agent (>24hrs) due to surgical blood loss or risk of bleeding: no  INDICATION FOR PROCEDURE: Iyania Denne is a 36 y.o. female with CKD V nearing ESRD. After careful discussion of risks, benefits, and alternatives the patient was offered laparoscopic peritoneal dialysis catheter placement. The patient understood and wished to proceed.  OPERATIVE FINDINGS: unremarkable PD catheter placement.  DESCRIPTION OF PROCEDURE: After identification of the patient in the pre-operative holding area, the patient was transferred to the operating room. The patient was positioned supine on the operating room table. Anesthesia was induced. The abdomen was prepped and draped in standard fashion. A surgical pause was performed confirming correct patient, procedure, and operative location.  A Veress needle was introduced into the abdomen at Palmer's point, immediately below the left costal margin.  A saline drop test was used to confirm intra-abdominal position.  The Veress needle was connected to insufflation tubing and insufflation initiated.  A low opening pressure and good flow rate were noted.  A 5 mm trocar was introduced into the right upper quadrant using Visi-View technique.  The obturator was  removed and the abdomen inspected with a 30 degree angled laparoscope.  No evidence of Veress needle injury was noted.  An additional 5 mm trocar was inserted a handsbreadth away to facilitate the case.  The omentum was grasped with an atraumatic grasper and elevated to the upper abdomen.  A laparoscopic suture passer was used to deliver a 0 Prolene suture through the omentum.  The suture was secured down to the anterior abdominal wall with a knot.  The omentum was pexied in place.  The abdomen was desufflated.  The peritoneal catheter was measured across the abdominal wall surface.  A mark was made by the cuff in the periumbilical abdomen.  The abdomen was reinsufflated.  An advantage 5 mm trocar was then used to create a skiving path through the anterior rectus fascia, rectus muscle, and peritoneum.  The laparoscopic trocar entered in the suprapubic abdomen directing towards the pelvis.  The working end of the catheter was delivered through the trocar.  The cuff was brought into the abdomen and then placed back into the rectus muscle.  The abdomen was desufflated again.  A "swan-neck" extension tubing for the dialysis catheter was brought onto the field.  The catheter was laid across the desufflated abdomen to lay across the upper quadrant and exit the left abdomen.  The catheter was cut.  The 2 pieces of catheter were then [facility] using a Christmas tree adapter.  This was secured in place with 2 interrupted sutures of 0 Prolene.  The connected catheter was then tunneled to a counterincision in the upper quadrant and then out the lateral abdomen in a downward deflection.  Great care was taken to avoid twisting or  kinking the catheter.  The abdomen was reinsufflated.  The peritoneum was inspected to ensure the catheter did not enter the cavity.  Satisfied we desufflated the abdomen.  The catheter was connected to cystoscopy tubing.  The catheter flushed and drained without any difficulty.  The trochars were  removed.  All incisions were closed with interrupted 4-0 Monocryl sutures.  Dermabond was applied.  A sterile bandage was applied to the peritoneal dialysis catheter.   Upon completion of the case instrument and sharps counts were confirmed correct. The patient was transferred to the PACU in good condition. I was present for all portions of the procedure.  Yevonne Aline. Stanford Breed, MD [facility] Phone Number: [phone] 04/18/2021 1:46 PM

## 2021-04-18 NOTE — Anesthesia Preprocedure Evaluation (Signed)
Anesthesia Evaluation  Patient identified by MRN, date of birth, ID band Patient awake    Reviewed: Allergy & Precautions, NPO status   Airway Mallampati: II  TM Distance: >3 FB     Dental   Pulmonary    breath sounds clear to auscultation       Cardiovascular  Rhythm:Regular Rate:Normal     Neuro/Psych    GI/Hepatic Neg liver ROS,   Endo/Other    Renal/GU Renal disease     Musculoskeletal   Abdominal   Peds  Hematology   Anesthesia Other Findings   Reproductive/Obstetrics                             Anesthesia Physical Anesthesia Plan  ASA: 3  Anesthesia Plan: General   Post-op Pain Management:    Induction:   PONV Risk Score and Plan: Treatment may vary due to age or medical condition  Airway Management Planned:   Additional Equipment:   Intra-op Plan:   Post-operative Plan:   Informed Consent:   Plan Discussed with:   Anesthesia Plan Comments:         Anesthesia Quick Evaluation

## 2021-04-18 NOTE — H&P (Signed)
History and Physical Interval Note:  04/18/2021 10:04 AM  Whitney Bennett  has presented today for surgery, with the diagnosis of ESRD.  The various methods of treatment have been discussed with the patient and family. After consideration of risks, benefits and other options for treatment, the patient has consented to  Procedure(s): [name]  (CAPD) CATHETER (N/A) as a surgical intervention.  The patient's history has been reviewed, patient examined, no change in status, stable for surgery.  I have reviewed the patient's chart and labs.  Questions were answered to the patient's satisfaction.    PD catheter.  Risk and benefits discussed again.  [name]  Patient name: Whitney Bennett      MRN: [id number]        DOB: 1986/03/16            Sex: female   REASON FOR CONSULT: Evaluate PD catheter placement   HPI: Whitney Bennett is a 36 y.o. 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  Patient states she has spoken with her nephrologist Dr. Joelyn Oms and given her active lifestyle would like to consider PD catheter.  She has had no previous abdominal surgeries.  Has not started dialysis at this time.  States she was told she had 11% kidney function.  She has 2 children with no plans for further pregnancies at this time.       Past Medical History:  Diagnosis Date   Anxiety     Chronic kidney disease      with proteinuria           Past Surgical History:  Procedure Laterality Date   NO PAST SURGERIES               Family History  Problem Relation Age of Onset   Diabetes Mother     Thyroid disease Mother     Cervical polyp Mother     Post-traumatic stress disorder Mother     Hypercholesterolemia Father     Depression Father     Hypertension Father     Heart disease Brother     Diabetes Maternal Grandmother      Diabetes Maternal Grandfather     Stroke Maternal Grandfather     Diabetes Paternal Grandmother     Alcohol abuse Paternal Grandfather     Alcoholism Paternal Grandfather     Other Neg Hx        SOCIAL HISTORY: Social History         Socioeconomic History   Marital status: Married      Spouse name: Not on file   Number of children: 2   Years of education: 14   Highest education level: Not on file  Occupational History   Occupation: Sports coach office      Comment: senior paralegal  Tobacco Use   Smoking status: Never   Smokeless tobacco: Never  Substance and Sexual Activity   Alcohol use: Yes      Alcohol/week: 1.0 - 2.0 standard drink      Types: 1 - 2 Standard drinks or equivalent per week      Comment: quit 2019.  Previously occasionally   Drug use: No   Sexual activity: Yes      Partners: Male      Birth control/protection: Condom  Other Topics Concern   Not on file  Social History Narrative  Lives at home with husband and 2 daughters.  2 cats, a dog and her father.    Social Determinants of Health    Financial Resource Strain: Not on file  Food Insecurity: Not on file  Transportation Needs: Not on file  Physical Activity: Not on file  Stress: Not on file  Social Connections: Not on file  Intimate Partner Violence: Not on file          Allergies  Allergen Reactions   Lisinopril Cough            Current Outpatient Medications  Medication Sig Dispense Refill   buPROPion ER (WELLBUTRIN SR) 100 MG 12 hr tablet 1 tab by mouth every 48 hours. 15 tablet 11   Cholecalciferol 25 MCG (1000 UT) tablet Take by mouth.       Ferrous Sulfate 27 MG TABS Take by mouth daily.       acetaminophen (TYLENOL) 500 MG tablet Take 500-1,000 mg by mouth as needed for mild pain, moderate pain or fever.  (Patient not taking: Reported on 11/24/2020)        No current facility-administered medications for this visit.      REVIEW OF SYSTEMS:  [X]  denotes positive finding, [ ]  denotes  negative finding Cardiac   Comments:  Chest pain or chest pressure:      Shortness of breath upon exertion:      Short of breath when lying flat:      Irregular heart rhythm:             Vascular      Pain in calf, thigh, or hip brought on by ambulation:      Pain in feet at night that wakes you up from your sleep:       Blood clot in your veins:      Leg swelling:              Pulmonary      Oxygen at home:      Productive cough:       Wheezing:              Neurologic      Sudden weakness in arms or legs:       Sudden numbness in arms or legs:       Sudden onset of difficulty speaking or slurred speech:      Temporary loss of vision in one eye:       Problems with dizziness:              Gastrointestinal      Blood in stool:       Vomited blood:              Genitourinary      Burning when urinating:       Blood in urine:             Psychiatric      Major depression:              Hematologic      Bleeding problems:      Problems with blood clotting too easily:             Skin      Rashes or ulcers:             Constitutional      Fever or chills:          PHYSICAL EXAM:    Vitals:    03/20/21  1351  BP: (!) 137/93  Pulse: 73  Resp: 14  Temp: 98.6 F (37 C)  TempSrc: Temporal  SpO2: 98%  Weight: 220 lb (99.8 kg)  Height: 5\' 6"  (1.676 m)      GENERAL: The patient is a well-nourished female, in no acute distress. The vital signs are documented above. CARDIAC: There is a regular rate and rhythm.  VASCULAR:  Palpable femoral pulses bilaterally PULMONARY: No respiratory distress ABDOMEN: Soft and non-tender MUSCULOSKELETAL: There are no major deformities or cyanosis. NEUROLOGIC: No focal weakness or paresthesias are detected. SKIN: There are no ulcers or rashes noted. PSYCHIATRIC: The patient has a normal affect.   DATA:    None   Assessment/Plan:   36 year old female with stage IV CKD secondary to FSGS that presents for evaluation of PD  catheter placement.  She has no previous abdominal surgery and I think she would be a good candidate for PD catheter if she decides to proceed.  I discussed basic steps of the operation as well as placement of the peritoneal catheter in the peritoneal cavity.  Discussed risk and benefits including risk of injury to the abdominal structures including catheter kinking, getting infected, etc.  Ultimately offered to schedule her today but she is going to discuss with Dr. Joelyn Oms and her nephrology office best timing to proceed with scheduling.     [name], MD [facility] [place] Office: [phone]

## 2021-04-18 NOTE — Anesthesia Postprocedure Evaluation (Signed)
Anesthesia Post Note  Patient: Whitney Bennett  Procedure(s) Performed: LAPAROSCOPIC INSERTION CONTINUOUS AMBULATORY PERITONEAL DIALYSIS  (CAPD) CATHETER WITH OMENTOPEXY (Abdomen)     Patient location during evaluation: PACU Anesthesia Type: General Level of consciousness: awake Pain management: pain level controlled Vital Signs Assessment: post-procedure vital signs reviewed and stable Respiratory status: spontaneous breathing Cardiovascular status: stable Postop Assessment: no apparent nausea or vomiting Anesthetic complications: no   No notable events documented.  Last Vitals:  Vitals:   04/18/21 1220 04/18/21 1235  BP: (!) 107/92 (!) 116/105  Pulse: 74 73  Resp: 12 12  Temp:    SpO2: 100% 100%    Last Pain:  Vitals:   04/18/21 0830  TempSrc:   PainSc: 0-No pain                 Kynley Metzger

## 2021-04-19 ENCOUNTER — Encounter (HOSPITAL_COMMUNITY): Payer: Self-pay | Admitting: Vascular Surgery

## 2021-04-23 ENCOUNTER — Encounter: Payer: Self-pay | Admitting: *Deleted

## 2021-05-01 ENCOUNTER — Telehealth: Payer: Self-pay

## 2021-05-01 NOTE — Telephone Encounter (Signed)
Pt called to see if she needed to f/u in clinic s/p Bussey placement. Per MD, she does not need to unless she is having issues. Advised pt to contact HD center to get their guidance on further caring for site etc. Pt verbalized understanding. No further questions/concerns.

## 2021-05-02 ENCOUNTER — Telehealth: Payer: Self-pay

## 2021-05-02 NOTE — Telephone Encounter (Signed)
Patient called asking to get a post-op appointment to get some questions answered and have a check up to make sure everything is fine.

## 2021-05-16 NOTE — Telephone Encounter (Signed)
Patient has been scheduled for 05/18/21

## 2021-05-18 ENCOUNTER — Encounter: Payer: Self-pay | Admitting: Internal Medicine

## 2021-05-18 ENCOUNTER — Ambulatory Visit: Payer: BLUE CROSS/BLUE SHIELD | Admitting: Internal Medicine

## 2021-05-18 ENCOUNTER — Other Ambulatory Visit: Payer: Self-pay

## 2021-05-18 VITALS — BP 118/78 | HR 84 | Resp 12 | Ht 66.0 in | Wt 210.5 lb

## 2021-05-18 DIAGNOSIS — F32A Depression, unspecified: Secondary | ICD-10-CM | POA: Diagnosis not present

## 2021-05-18 DIAGNOSIS — F419 Anxiety disorder, unspecified: Secondary | ICD-10-CM

## 2021-05-18 DIAGNOSIS — N184 Chronic kidney disease, stage 4 (severe): Secondary | ICD-10-CM

## 2021-05-18 NOTE — Progress Notes (Incomplete)
° ° °  Subjective:    Patient ID: Whitney Bennett, female   DOB: [birth year], 36 y.o.   MRN: [id number]   HPI  CAPD dialysis catheters placed 04/18/2021.  Started dialysis training and 3 cycles she believes on 05/04/21.  Has 4 cycles with dialysate in for about 1.5 hours starting last night..  15-20 minutes in and out.  Only performing cycles at night.  Did trainings at [employer].   Had some significant cramping with end of the draining period.  The computer system is hooked up to the dialysate apparatus and dialysate is analyzed upon outflow.   Also dealing with anemia and low iron levels and low erythropoietin.   Oral iron was not helping, so was given IV iron 3 days ago.  Plans for another IV dose every 2 weeks after speaking with Jenny Reichmann, her nurse at dialysis center.  She also confirmed her low erythropoietin level and that was given low dose recently as well with hemoglobin measuring 10.6.     Has blood draws monthly.     She is concerned about communication with all of her providers.    Transplant list:  has been on transplant list for about 1 year, which she did not know.    She has been set up through [employer] with a nutritionist and SW.  SW has contact with [facility] where her transplant provider.    Concerned as has not had surgical of follow up from placement of CAPD catheter.  Having discomfort and lumps in areas of surgery (laparoscopic)   Depression:  medication and counseling have been very helpful.  Apparently, was discussed with renal, Dr. Joelyn Oms, regarding a possible problem with her chronic renal failure.  Prescribed Sertraline in past, but apparently never picked up--2020.  She would like to continue Bupropion.  Current Meds  Medication Sig   acetaminophen (TYLENOL) 500 MG tablet Take 500 mg by mouth every 6 (six) hours as needed for mild pain or moderate pain.   buPROPion ER (WELLBUTRIN SR) 100 MG 12 hr tablet 1 tab by mouth every 48 hours.   calcitRIOL (ROCALTROL)  0.25 MCG capsule Take 0.25 mcg by mouth 2 (two) times daily.   Allergies  Allergen Reactions   Lisinopril Cough     Review of Systems    Objective:   BP 118/78 (BP Location: Left Arm, Patient Position: Sitting, Cuff Size: Normal)    Pulse 84    Resp 12    Ht 5\' 6"  (1.676 m)    Wt 210 lb 8 oz (95.5 kg)    LMP 04/25/2021 (Within Days)    BMI 33.98 kg/m   Physical Exam NAD Lungs:  CTA CV:  RRR without murmur or rub.  Radial pulses normal and equal   Assessment & Plan   Call Dr. Joelyn Oms regarding Bupropion--whether okay to continue as she would prefer.   [facility], Verdie Drown? is her counselor

## 2021-06-05 ENCOUNTER — Ambulatory Visit (HOSPITAL_COMMUNITY)
Admission: EM | Admit: 2021-06-05 | Discharge: 2021-06-05 | Disposition: A | Discharge status: Home / Self Care | Payer: BLUE CROSS/BLUE SHIELD | Attending: Emergency Medicine | Admitting: Emergency Medicine

## 2021-06-05 ENCOUNTER — Other Ambulatory Visit: Payer: Self-pay

## 2021-06-05 ENCOUNTER — Encounter (HOSPITAL_COMMUNITY): Payer: Self-pay | Admitting: Emergency Medicine

## 2021-06-05 DIAGNOSIS — J069 Acute upper respiratory infection, unspecified: Secondary | ICD-10-CM | POA: Insufficient documentation

## 2021-06-05 LAB — POCT RAPID STREP A, ED / UC: Streptococcus, Group A Screen (Direct): NEGATIVE

## 2021-06-05 MED ORDER — LIDOCAINE VISCOUS HCL 2 % MT SOLN: 15.0000 mL | OROMUCOSAL | 0 refills | Status: DC | PRN

## 2021-06-05 NOTE — ED Triage Notes (Signed)
Pt reports sore throat, cough, fatigue and generalized body aches since Friday. Pt states she is a dialysis patient and has had a weight lost of 2 kg over the weekend.  ?

## 2021-06-05 NOTE — Discharge Instructions (Addendum)
Your symptoms today are most likely being caused by a virus and should steadily improve in time it can take up to 7 to 10 days before you truly start to see a turnaround however things will get better ? ?Strep test was negative, it has been sent to the lab to determine if bacteria will grow, if bacteria grows you will be notified and medication sent in at that time ? ?May gargle and spit lidocaine solution every 4 hours as needed to give temporary relief to your sore throat ?   ?You can take Tylenol and/or Ibuprofen as needed for fever reduction and pain relief. ?  ?For cough: honey 1/2 to 1 teaspoon (you can dilute the honey in water or another fluid).  You can also use guaifenesin and dextromethorphan for cough. You can use a humidifier for chest congestion and cough.  If you don't have a humidifier, you can sit in the bathroom with the hot shower running.    ?  ?For sore throat: try warm salt water gargles, cepacol lozenges, throat spray, warm tea or water with lemon/honey, popsicles or ice, or OTC cold relief medicine for throat discomfort. ?  ?For congestion: take a daily anti-histamine like Zyrtec, Claritin, and a oral decongestant, such as pseudoephedrine.  You can also use Flonase 1-2 sprays in each nostril daily. ?  ?It is important to stay hydrated: drink plenty of fluids (water, gatorade/powerade/pedialyte, juices, or teas) to keep your throat moisturized and help further relieve irritation/discomfort.  ?

## 2021-06-05 NOTE — ED Provider Notes (Signed)
?[place] ? ? ? ?CSN: [id number] ?Arrival date & time: 06/05/21  1026 ? ? ?  ? ?History   ?Chief Complaint ?Chief Complaint  ?Patient presents with  ? Sore Throat  ? Generalized Body Aches  ? Cough  ? Fatigue  ? ? ?HPI ?Whitney Bennett is a 36 y.o. female.  ? ?Patient presents with fever, chills, nasal congestion, rhinorrhea, sore throat, hoarseness, nonproductive cough, nausea without vomiting and generalized headaches for 5 days.  Fever has resolved.  Decreased appetite and minimal fluid intake.  Known sick contact in household.  Has attempted use of over-the-counter Tylenol Cold and flu which has been helpful.  History of peritoneal dialysis, endorses a 2 pound weight loss over the weekend.  ? ? ?Past Medical History:  ?Diagnosis Date  ? Anxiety   ? Chronic kidney disease   ? with proteinuria Focal Segvnmental Glomerulosclerosis  ? Depression   ? ? ?Patient Active Problem List  ? Diagnosis Date Noted  ? Current moderate episode of major depressive disorder ([place]) 12/03/2020  ? Fever   ? COVID-19   ? Acute renal failure ([place]) 02/17/2020  ? Mixed hyperlipidemia 08/24/2018  ? Anxiety and depression 08/24/2018  ? CKD (chronic kidney disease) stage 4, GFR 15-29 ml/min (HCC)   ? Breast tenderness in female 11/14/2015  ? MONILIAL VAGINITIS 09/20/2008  ? VISUAL IMPAIRMENT 09/20/2008  ? HEARING DEFICIT 09/20/2008  ? ABSCESS, TOOTH 06/10/2008  ? URINARY TRACT INFECTION, RECURRENT 06/10/2008  ? OTH D/O MENSTRUATION&OTH ABN BLEED FE GNT TRACT 06/10/2008  ? ? ?Past Surgical History:  ?Procedure Laterality Date  ? CAPD INSERTION N/A 04/18/2021  ? Procedure: LAPAROSCOPIC INSERTION CONTINUOUS AMBULATORY PERITONEAL DIALYSIS  (CAPD) CATHETER WITH OMENTOPEXY;  Surgeon: Cherre Robins, MD;  Location: [facility] OR;  Service: Vascular;  Laterality: N/A;  ? NO PAST SURGERIES    ? ? ?OB History   ? ? Gravida  ?2  ? Para  ?1  ? Term  ?1  ? Preterm  ?   ? AB  ?0  ? Living  ?2  ?  ? ? SAB  ?   ? IAB  ?   ? Ectopic  ?0  ?  Multiple  ?   ? Live Births  ?1  ?   ?  ?  ? ? ? ?Home Medications   ? ?Prior to Admission medications   ?Medication Sig Start Date End Date Taking? Authorizing Provider  ?acetaminophen (TYLENOL) 500 MG tablet Take 500 mg by mouth every 6 (six) hours as needed for mild pain or moderate pain.    [provider]  ?buPROPion ER (WELLBUTRIN SR) 100 MG 12 hr tablet 1 tab by mouth every 48 hours. 01/12/21   Mack Hook, MD  ?calcitRIOL (ROCALTROL) 0.25 MCG capsule Take 0.25 mcg by mouth 2 (two) times daily.    [provider]  ?Cholecalciferol 25 MCG (1000 UT) tablet Take 1,000 Units by mouth daily. ?Patient not taking: Reported on 05/18/2021    [provider]  ?Ferrous Sulfate 27 MG TABS Take by mouth daily. ?Patient not taking: Reported on 05/18/2021    [provider]  ?oxyCODONE-acetaminophen (PERCOCET/ROXICET) 5-325 MG tablet Take 1 tablet by mouth every 6 (six) hours as needed. ?Patient not taking: Reported on 05/18/2021 04/18/21   Ulyses Amor, PA-C  ?sodium bicarbonate 650 MG tablet Take 650 mg by mouth 2 (two) times daily. ?Patient not taking: Reported on 05/18/2021 02/12/21   [provider]  ? ? ?Family  History ?Family History  ?Problem Relation Age of Onset  ? Diabetes Mother   ? Thyroid disease Mother   ? Cervical polyp Mother   ? Post-traumatic stress disorder Mother   ? Hypercholesterolemia Father   ? Depression Father   ? Hypertension Father   ? Heart disease Brother   ? Diabetes Maternal Grandmother   ? Diabetes Maternal Grandfather   ? Stroke Maternal Grandfather   ? Diabetes Paternal Grandmother   ? Alcohol abuse Paternal Grandfather   ? Alcoholism Paternal Grandfather   ? Other Neg Hx   ? ? ?Social History ?Social History  ? ?Tobacco Use  ? Smoking status: Never  ? Smokeless tobacco: Never  ?Vaping Use  ? Vaping Use: Never used  ?Substance Use Topics  ? Alcohol use: Not Currently  ?  Comment: quit 2019.  Previously occasionally  ? Drug use: No   ? ? ? ?Allergies   ?Lisinopril ? ? ?Review of Systems ?Review of Systems  ?Constitutional:  Positive for appetite change, chills and fever. Negative for activity change, diaphoresis, fatigue and unexpected weight change.  ?HENT:  Positive for congestion, postnasal drip, rhinorrhea and sore throat. Negative for dental problem, drooling, ear discharge, ear pain, facial swelling, hearing loss, mouth sores, nosebleeds, sinus pressure, sinus pain, sneezing, tinnitus, trouble swallowing and voice change.   ?Respiratory:  Positive for cough. Negative for apnea, choking, chest tightness, shortness of breath, wheezing and stridor.   ?Cardiovascular: Negative.   ?Gastrointestinal:  Positive for nausea. Negative for abdominal distention, abdominal pain, anal bleeding, blood in stool, constipation, diarrhea, rectal pain and vomiting.  ?Skin: Negative.   ?Neurological:  Positive for headaches. Negative for dizziness, tremors, seizures, syncope, facial asymmetry, speech difficulty, weakness, light-headedness and numbness.  ? ? ?Physical Exam ?Triage Vital Signs ?ED Triage Vitals [06/05/21 1140]  ?Enc Vitals Group  ?   BP 103/69  ?   Pulse Rate 99  ?   Resp 17  ?   Temp 98.1 ?F (36.7 ?C)  ?   Temp Source Oral  ?   SpO2 100 %  ?   Weight   ?   Height 5\' 6"  (1.676 m)  ?   Head Circumference   ?   Peak Flow   ?   Pain Score 4  ?   Pain Loc   ?   Pain Edu?   ?   Excl. in [place]?   ? ?No data found. ? ?Updated Vital Signs ?BP 103/69 (BP Location: Left Arm)   Pulse 99   Temp 98.1 ?F (36.7 ?C) (Oral)   Resp 17   Ht 5\' 6"  (1.676 m)   SpO2 100%   BMI 33.98 kg/m?  ? ?Visual Acuity ?Right Eye Distance:   ?Left Eye Distance:   ?Bilateral Distance:   ? ?Right Eye Near:   ?Left Eye Near:    ?Bilateral Near:    ? ?Physical Exam ?Constitutional:   ?   Appearance: She is well-developed. She is ill-appearing.  ?HENT:  ?   Head: Normocephalic.  ?   Right Ear: Tympanic membrane, ear canal and external ear normal.  ?   Left Ear: Tympanic membrane,  ear canal and external ear normal.  ?   Nose: Congestion and rhinorrhea present.  ?   Mouth/Throat:  ?   Mouth: Mucous membranes are moist.  ?   Pharynx: Posterior oropharyngeal erythema present.  ?Eyes:  ?   Extraocular Movements: Extraocular movements intact.  ?Cardiovascular:  ?   Rate  and Rhythm: Normal rate and regular rhythm.  ?   Pulses: Normal pulses.  ?   Heart sounds: Normal heart sounds.  ?Pulmonary:  ?   Effort: Pulmonary effort is normal.  ?   Breath sounds: Normal breath sounds.  ?Musculoskeletal:  ?   Cervical back: Normal range of motion and neck supple.  ?Skin: ?   General: Skin is warm and dry.  ?Neurological:  ?   General: No focal deficit present.  ?   Mental Status: She is alert and oriented to person, place, and time.  ?Psychiatric:     ?   Mood and Affect: Mood normal.     ?   Behavior: Behavior normal.  ? ? ? ?UC Treatments / Results  ?Labs ?(all labs ordered are listed, but only abnormal results are displayed) ?Labs Reviewed - No data to display ? ?EKG ? ? ?Radiology ?No results found. ? ?Procedures ?Procedures (including critical care time) ? ?Medications Ordered in UC ?Medications - No data to display ? ?Initial Impression / Assessment and Plan / UC Course  ?I have reviewed the triage vital signs and the nursing notes. ? ?Pertinent labs & imaging results that were available during my care of the patient were reviewed by me and considered in my medical decision making (see chart for details). ? ?URI with cough ? ?Vital signs are stable, well ill-appearing patient is in no signs of distress, strep test negative, sent for culture, etiology is symptoms are most likely viral obtain from the child and her household, discussed findings with patient, lidocaine viscous prescribed as sore throat and hoarseness are most worrisome symptom today, recommended throat lozenges warm liquids teaspoons of honey and rest for additional support, may follow-up with urgent care as needed, work note given ?Final  Clinical Impressions(s) / UC Diagnoses  ? ?Final diagnoses:  ?None  ? ?Discharge Instructions   ?None ?  ? ?ED Prescriptions   ?None ?  ? ?PDMP not reviewed this encounter. ?  ?Hans Eden, NP ?06/05/21 1247 ? ?

## 2021-06-08 LAB — CULTURE, GROUP A STREP (THRC)

## 2022-01-06 IMAGING — CT CT RENAL STONE PROTOCOL
2 of 4 series · 16 of 46 positions shown, 18 images · non-contrast
Comparison: None.

CLINICAL DATA: Flank pain. Kidney stone suspected. COVID positive.

EXAM:
CT ABDOMEN AND PELVIS WITHOUT CONTRAST
TECHNIQUE: Multidetector CT imaging of the abdomen and pelvis was performed
following the standard protocol without IV contrast.

[Series 3: stone study 5.0 i30f 2 · axial · 0.97mm/px · z∈[-760,-290]mm · 13 of 104 slices shown, 15 images]
[im 5/104  soft-tissue]
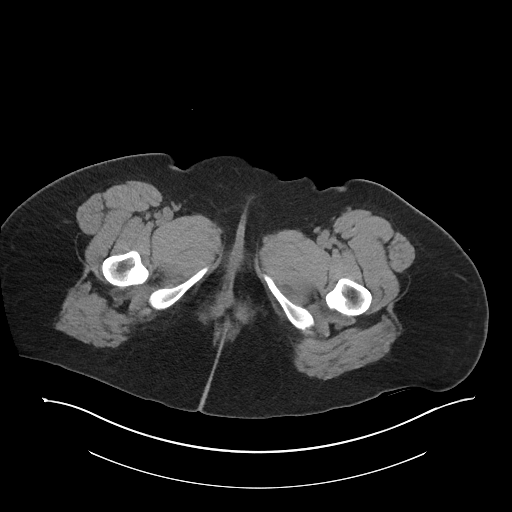
[im 5/104  bone]
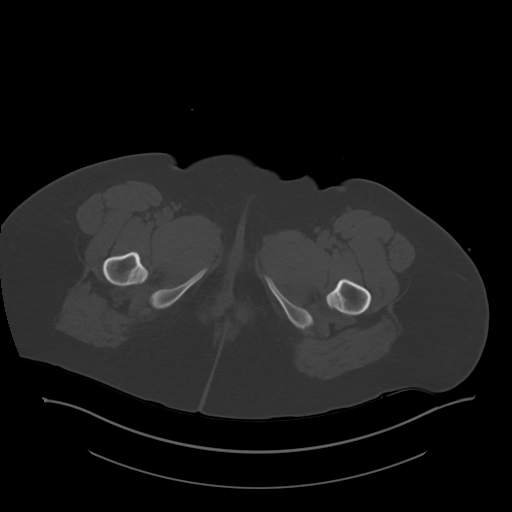
[im 14/104  soft-tissue]
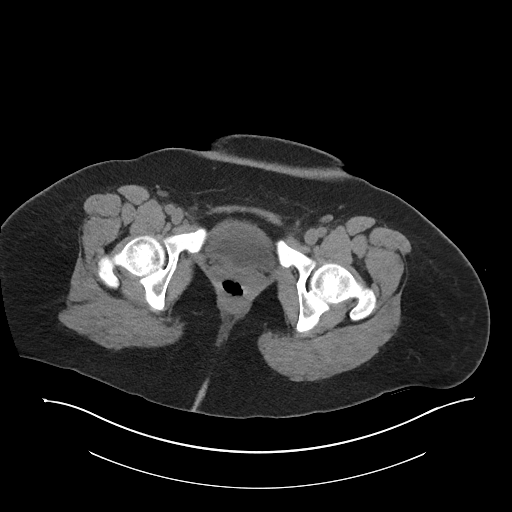
[im 23/104  soft-tissue]
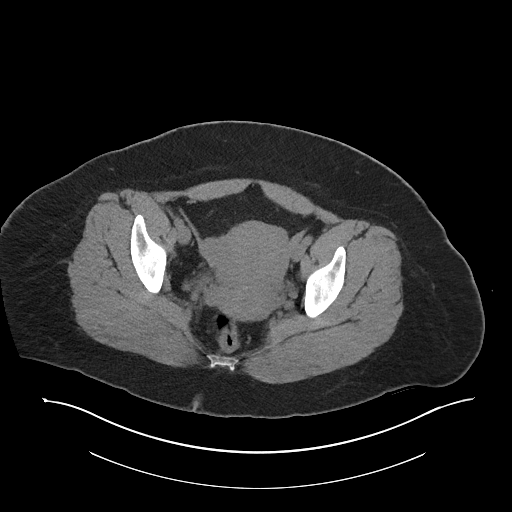
[im 27/104  soft-tissue]
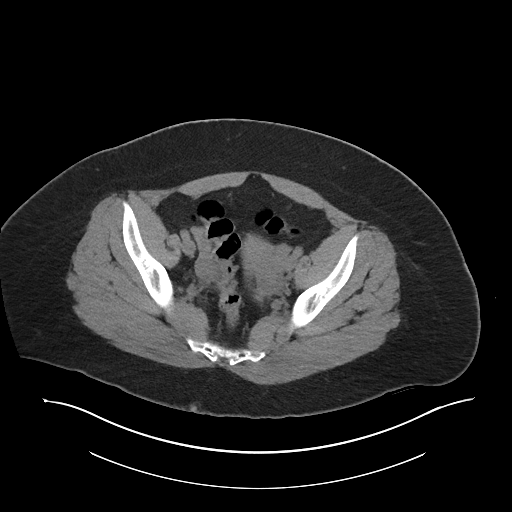
[im 36/104  soft-tissue]
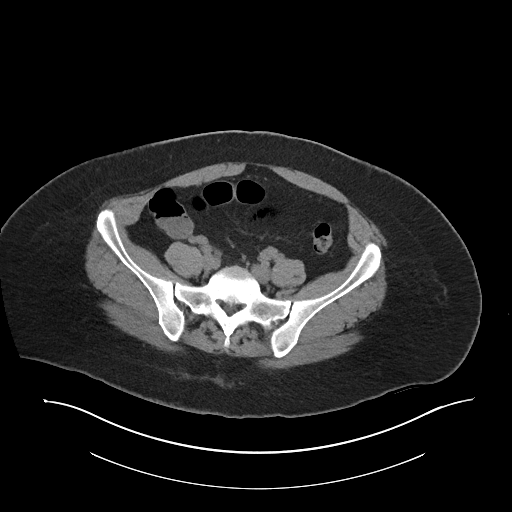
[im 45/104  soft-tissue]
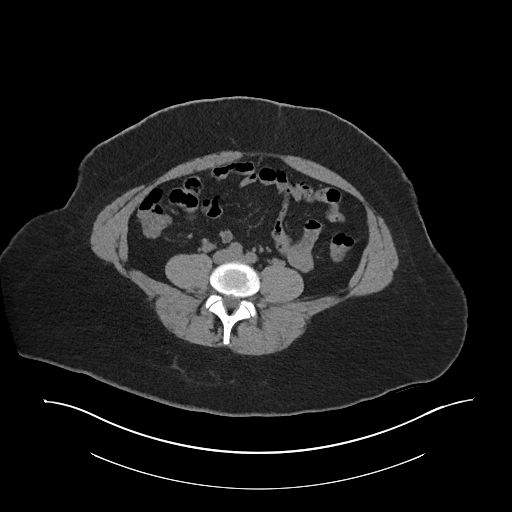
[im 54/104  soft-tissue]
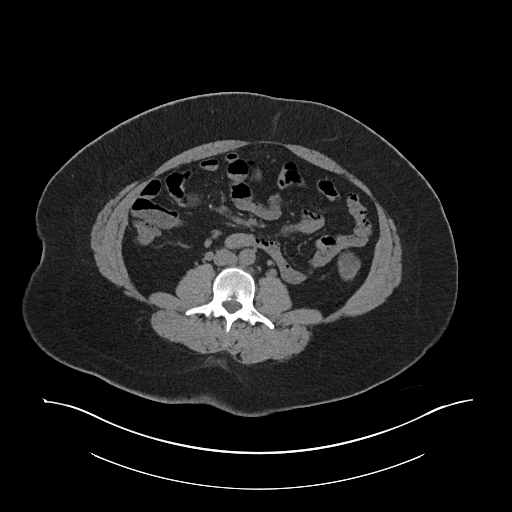
[im 59/104  soft-tissue]
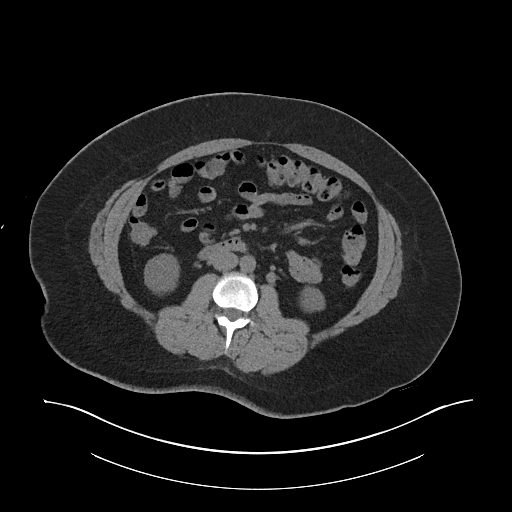
[im 68/104  soft-tissue]
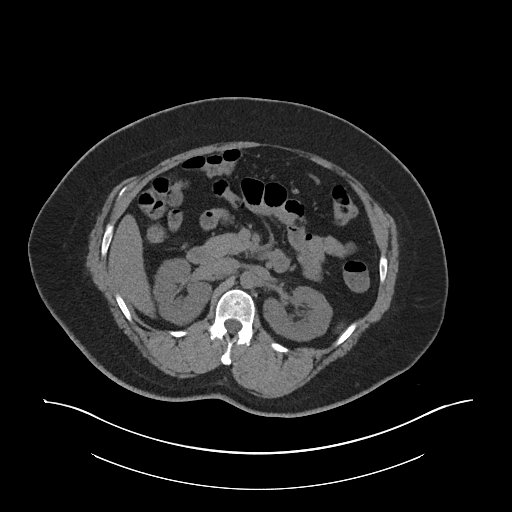
[im 68/104  bone]
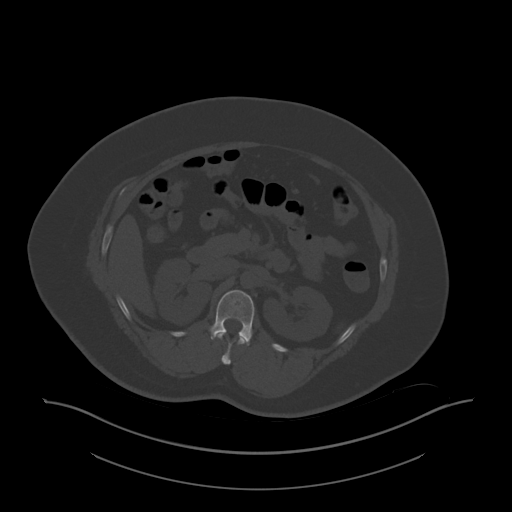
[im 77/104  soft-tissue]
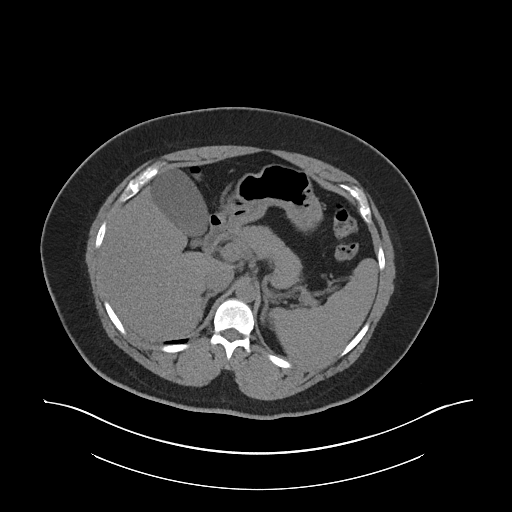
[im 81/104  soft-tissue]
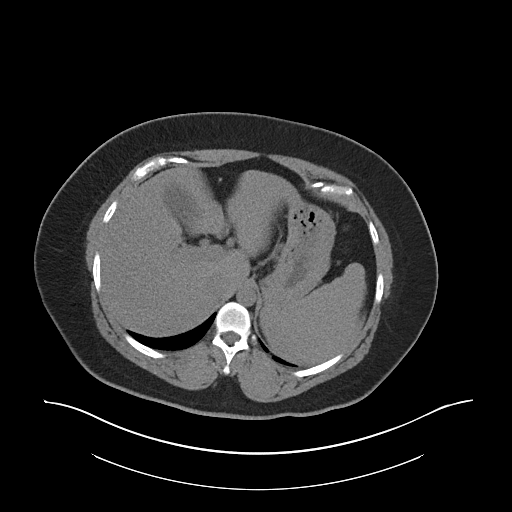
[im 90/104  soft-tissue]
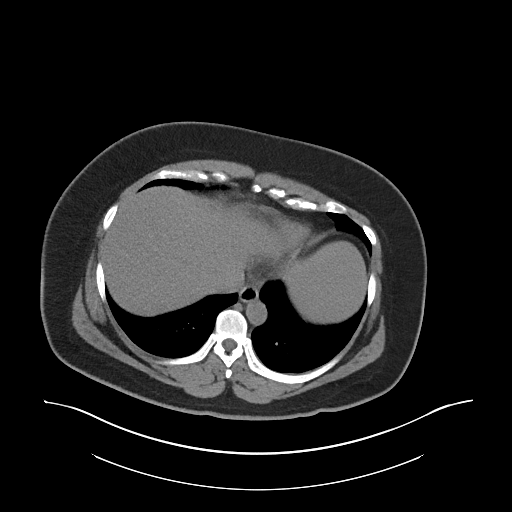
[im 99/104  soft-tissue]
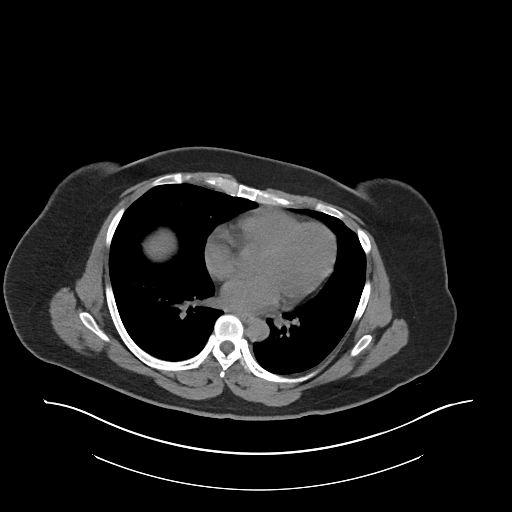

[Series 6: coronal soft tissue · coronal · 1.05mm/px · 3 of 111 slices shown]
[im 37/111  soft-tissue]
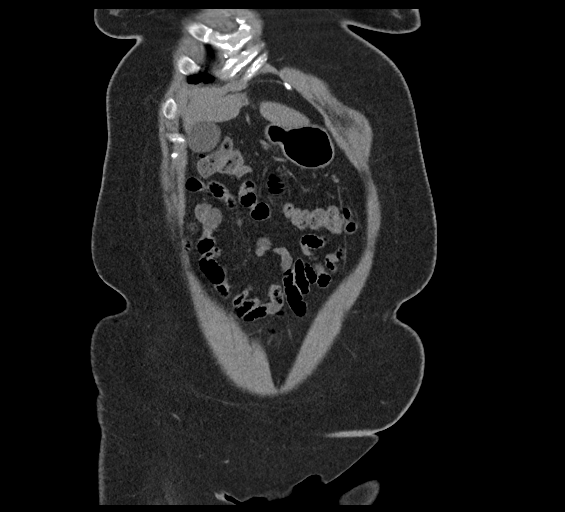
[im 49/111  soft-tissue]
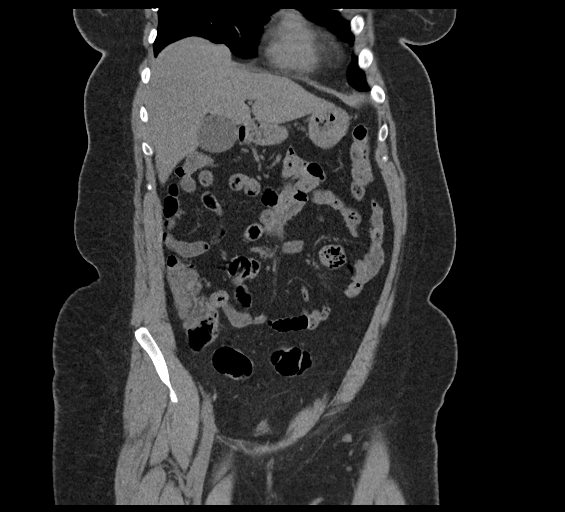
[im 62/111  soft-tissue]
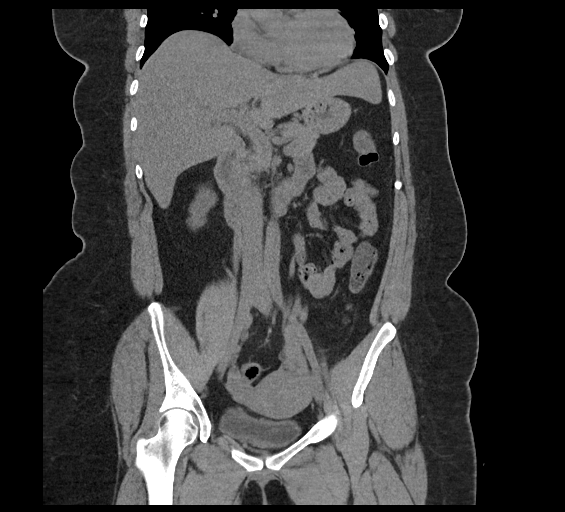

[16 of 46 positions shown; findings below may reference images not displayed]

FINDINGS: Lower chest: Lung bases are clear.  Heart size is normal.

Hepatobiliary: Liver is unremarkable. Gallbladder is mildly
distended without inflammation. The common bile duct is normal.

Pancreas: Unremarkable. No pancreatic ductal dilatation or
surrounding inflammatory changes.

Spleen: The spleen is enlarged, measuring up to 11 cm in coronal
dimension. No focal lesion is present.

Adrenals/Urinary Tract: A punctate nonobstructing stone is present
at the upper pole of the left kidney. No significant right-sided
stones are present. No mass lesion is present. No hydronephrosis
present. The urinary bladder is mostly collapsed.

Stomach/Bowel: Stomach and duodenum are within normal limits. Small
bowel is unremarkable. Terminal ileum is within normal limits. The
appendix is visualized and normal. The ascending and transverse
colon are within normal limits. The descending and sigmoid colon are
normal.

Vascular/Lymphatic: No significant vascular findings are present. No
enlarged abdominal or pelvic lymph nodes.

Reproductive: Uterus and bilateral adnexa are unremarkable.

Other: No abdominal wall hernia or abnormality. No abdominopelvic
ascites.

Musculoskeletal: No acute or significant osseous findings.
IMPRESSION: 1. Punctate nonobstructing stone at the upper pole of the left
kidney.
2. No other acute or focal lesion to explain the patient's symptoms.
3. Splenomegaly.

## 2022-04-02 ENCOUNTER — Telehealth: Payer: Self-pay | Admitting: Internal Medicine

## 2022-04-02 NOTE — Telephone Encounter (Signed)
Question from family member who is not a [place] legal resident and wanted to know how they can be tested to know if they are a good tissue match for kidney transplant.   Called main line with [facility] and transferred to coordinator, Gerrit Heck, voicemail.   Can go on line to fill out living donor application, but again, they want to know only if can be tested.

## 2022-05-13 ENCOUNTER — Other Ambulatory Visit: Payer: Self-pay | Admitting: Internal Medicine

## 2022-05-20 ENCOUNTER — Encounter: Payer: Self-pay | Admitting: Internal Medicine

## 2022-05-20 ENCOUNTER — Ambulatory Visit: Payer: Self-pay | Admitting: Internal Medicine

## 2022-05-20 VITALS — BP 130/80 | HR 82 | Resp 16 | Ht 66.0 in | Wt 229.0 lb

## 2022-05-20 DIAGNOSIS — R059 Cough, unspecified: Secondary | ICD-10-CM

## 2022-05-20 DIAGNOSIS — U071 COVID-19: Secondary | ICD-10-CM

## 2022-05-20 DIAGNOSIS — N184 Chronic kidney disease, stage 4 (severe): Secondary | ICD-10-CM

## 2022-05-20 DIAGNOSIS — R252 Cramp and spasm: Secondary | ICD-10-CM

## 2022-05-20 LAB — POC COVID19 BINAXNOW: SARS Coronavirus 2 Ag: NEGATIVE

## 2022-05-20 MED ORDER — NIRMATRELVIR/RITONAVIR (PAXLOVID)TABLET: ORAL_TABLET | ORAL | 0 refills | Status: DC

## 2022-05-20 MED ORDER — ALBUTEROL SULFATE HFA 108 (90 BASE) MCG/ACT IN AERS: 2.0000 | INHALATION_SPRAY | RESPIRATORY_TRACT | 2 refills | Status: DC | PRN

## 2022-05-20 NOTE — Progress Notes (Signed)
    Subjective:    Patient ID: Whitney Bennett, female   DOB: 12/29/1985, 37 y.o.   MRN: [id number]   HPI  Started with sore throat, cough, and headaches started 5 days ago.  Performed home test for COVID and was positive.  Had bad bodyaches, Fever for the first 2 days up to 101.1 orally.   Started with muscle cramps all over yesterday and last night began feeling more short of breath.   No longer with the full body aches, but does feel pain in muscles after cramping episodes.  Cramping pulling her hands in toward her wrists.. She has been using her father in law's topical magnesium sulfate spray, which she feels has helped.   Using VapoRub, Tylenol. Drinking lots of water, hot teas, chicken broth.   She is not on a fluid restriction. She tries to avoid too much sodium, but states no definitive sodium restriction as well.    Current Meds  Medication Sig  . acetaminophen (TYLENOL) 500 MG tablet Take 500 mg by mouth every 6 (six) hours as needed for mild pain or moderate pain.  . B Complex-C-Folic Acid (RENAL VITAMIN PO) Take by mouth.  Marland Kitchen buPROPion ER (WELLBUTRIN SR) 100 MG 12 hr tablet TAKE 1 TABLET BY MOUTH EVERY 48 HOURS  . calcitRIOL (ROCALTROL) 0.25 MCG capsule Take 0.25 mcg by mouth 2 (two) times daily.   Allergies  Allergen Reactions  . Lisinopril Cough     Review of Systems    Objective:   BP 130/80 (BP Location: Right Arm, Patient Position: Sitting, Cuff Size: Normal)   Pulse 82   Resp 16   Ht 5' 6"$  (1.676 m)   Wt 229 lb (103.9 kg)   SpO2 98%   BMI 36.96 kg/m   Physical Exam   Assessment & Plan   ***

## 2022-05-20 NOTE — Patient Instructions (Signed)
Call report to Whitney Bennett daily for next 2 days Go to ED for increased shortness of breath, chest discomfort, new onset fever, mental status changes

## 2022-05-21 LAB — COMPREHENSIVE METABOLIC PANEL
ALT: 14 IU/L (ref 0–32)
AST: 16 IU/L (ref 0–40)
Albumin/Globulin Ratio: 1.4 (ref 1.2–2.2)
Albumin: 3.7 g/dL — ABNORMAL LOW (ref 3.9–4.9)
Alkaline Phosphatase: 49 IU/L (ref 44–121)
BUN/Creatinine Ratio: 6 — ABNORMAL LOW (ref 9–23)
BUN: 54 mg/dL — ABNORMAL HIGH (ref 6–20)
Bilirubin Total: 0.3 mg/dL (ref 0.0–1.2)
CO2: 15 mmol/L — ABNORMAL LOW (ref 20–29)
Calcium: 6 mg/dL — CL (ref 8.7–10.2)
Chloride: 105 mmol/L (ref 96–106)
Creatinine, Ser: 8.97 mg/dL — ABNORMAL HIGH (ref 0.57–1.00)
Globulin, Total: 2.7 g/dL (ref 1.5–4.5)
Glucose: 86 mg/dL (ref 70–99)
Potassium: 4.1 mmol/L (ref 3.5–5.2)
Sodium: 139 mmol/L (ref 134–144)
Total Protein: 6.4 g/dL (ref 6.0–8.5)
eGFR: 5 mL/min/{1.73_m2} — ABNORMAL LOW (ref >59)

## 2022-05-21 LAB — CBC WITH DIFFERENTIAL/PLATELET
Basophils Absolute: 0.1 10*3/uL (ref 0.0–0.2)
Basos: 1 %
EOS (ABSOLUTE): 0.2 10*3/uL (ref 0.0–0.4)
Eos: 3 %
Hematocrit: 28.3 % — ABNORMAL LOW (ref 34.0–46.6)
Hemoglobin: 9.4 g/dL — ABNORMAL LOW (ref 11.1–15.9)
Immature Grans (Abs): 0 10*3/uL (ref 0.0–0.1)
Immature Granulocytes: 0 %
Lymphocytes Absolute: 2.4 10*3/uL (ref 0.7–3.1)
Lymphs: 37 %
MCH: 28.4 pg (ref 26.6–33.0)
MCHC: 33.2 g/dL (ref 31.5–35.7)
MCV: 86 fL (ref 79–97)
Monocytes Absolute: 0.4 10*3/uL (ref 0.1–0.9)
Monocytes: 6 %
Neutrophils Absolute: 3.4 10*3/uL (ref 1.4–7.0)
Neutrophils: 53 %
Platelets: 242 10*3/uL (ref 150–450)
RBC: 3.31 x10E6/uL — ABNORMAL LOW (ref 3.77–5.28)
RDW: 14 % (ref 11.7–15.4)
WBC: 6.5 10*3/uL (ref 3.4–10.8)

## 2022-05-21 LAB — MAGNESIUM: Magnesium: 1.9 mg/dL (ref 1.6–2.3)

## 2022-08-14 ENCOUNTER — Other Ambulatory Visit: Payer: Self-pay | Admitting: Internal Medicine

## 2022-12-10 ENCOUNTER — Emergency Department (HOSPITAL_COMMUNITY)
Admission: EM | Admit: 2022-12-10 | Discharge: 2022-12-10 | Disposition: A | Discharge status: Home / Self Care | Payer: Managed Care, Other (non HMO) | Attending: Emergency Medicine | Admitting: Emergency Medicine

## 2022-12-10 ENCOUNTER — Emergency Department (HOSPITAL_COMMUNITY): Payer: Managed Care, Other (non HMO)

## 2022-12-10 ENCOUNTER — Encounter (HOSPITAL_COMMUNITY): Payer: Self-pay

## 2022-12-10 ENCOUNTER — Other Ambulatory Visit: Payer: Self-pay

## 2022-12-10 DIAGNOSIS — R319 Hematuria, unspecified: Secondary | ICD-10-CM | POA: Insufficient documentation

## 2022-12-10 DIAGNOSIS — N189 Chronic kidney disease, unspecified: Secondary | ICD-10-CM | POA: Insufficient documentation

## 2022-12-10 DIAGNOSIS — D72829 Elevated white blood cell count, unspecified: Secondary | ICD-10-CM | POA: Diagnosis not present

## 2022-12-10 DIAGNOSIS — Z992 Dependence on renal dialysis: Secondary | ICD-10-CM | POA: Insufficient documentation

## 2022-12-10 DIAGNOSIS — N390 Urinary tract infection, site not specified: Secondary | ICD-10-CM | POA: Insufficient documentation

## 2022-12-10 DIAGNOSIS — R109 Unspecified abdominal pain: Secondary | ICD-10-CM | POA: Diagnosis present

## 2022-12-10 LAB — URINALYSIS, ROUTINE W REFLEX MICROSCOPIC
Bilirubin Urine: NEGATIVE
Glucose, UA: 50 mg/dL — AB
Ketones, ur: NEGATIVE mg/dL
Nitrite: NEGATIVE
Protein, ur: 100 mg/dL — AB
Specific Gravity, Urine: 1.004 — ABNORMAL LOW (ref 1.005–1.030)
pH: 6 (ref 5.0–8.0)

## 2022-12-10 LAB — BASIC METABOLIC PANEL
Anion gap: 16 — ABNORMAL HIGH (ref 5–15)
BUN: 62 mg/dL — ABNORMAL HIGH (ref 6–20)
CO2: 18 mmol/L — ABNORMAL LOW (ref 22–32)
Calcium: 8.5 mg/dL — ABNORMAL LOW (ref 8.9–10.3)
Chloride: 100 mmol/L (ref 98–111)
Creatinine, Ser: 14.49 mg/dL — ABNORMAL HIGH (ref 0.44–1.00)
GFR, Estimated: 3 mL/min — ABNORMAL LOW (ref >60)
Glucose, Bld: 94 mg/dL (ref 70–99)
Potassium: 3.1 mmol/L — ABNORMAL LOW (ref 3.5–5.1)
Sodium: 134 mmol/L — ABNORMAL LOW (ref 135–145)

## 2022-12-10 LAB — CBC
HCT: 31.4 % — ABNORMAL LOW (ref 36.0–46.0)
Hemoglobin: 10.2 g/dL — ABNORMAL LOW (ref 12.0–15.0)
MCH: 27.8 pg (ref 26.0–34.0)
MCHC: 32.5 g/dL (ref 30.0–36.0)
MCV: 85.6 fL (ref 80.0–100.0)
Platelets: 234 10*3/uL (ref 150–400)
RBC: 3.67 MIL/uL — ABNORMAL LOW (ref 3.87–5.11)
RDW: 13.6 % (ref 11.5–15.5)
WBC: 13.8 10*3/uL — ABNORMAL HIGH (ref 4.0–10.5)
nRBC: 0 % (ref 0.0–0.2)

## 2022-12-10 LAB — BRAIN NATRIURETIC PEPTIDE: B Natriuretic Peptide: 21.5 pg/mL (ref 0.0–100.0)

## 2022-12-10 LAB — TROPONIN I (HIGH SENSITIVITY)
Troponin I (High Sensitivity): 6 ng/L (ref <18)
Troponin I (High Sensitivity): 6 ng/L (ref <18)

## 2022-12-10 LAB — HCG, SERUM, QUALITATIVE: Preg, Serum: NEGATIVE

## 2022-12-10 MED ORDER — HYDROMORPHONE HCL 1 MG/ML IJ SOLN
0.5000 mg | Freq: Once | INTRAMUSCULAR | Status: AC
  Administered 2022-12-10: 0.5 mg via INTRAVENOUS
  Filled 2022-12-10: qty 1

## 2022-12-10 MED ORDER — ONDANSETRON HCL 4 MG/2ML IJ SOLN
4.0000 mg | Freq: Once | INTRAMUSCULAR | Status: AC
  Administered 2022-12-10: 4 mg via INTRAVENOUS
  Filled 2022-12-10: qty 2

## 2022-12-10 MED ORDER — CEFDINIR 300 MG PO CAPS: 300.0000 mg | ORAL_CAPSULE | Freq: Two times a day (BID) | ORAL | 0 refills | Status: AC

## 2022-12-10 MED ORDER — CEFDINIR 300 MG PO CAPS
300.0000 mg | ORAL_CAPSULE | Freq: Once | ORAL | Status: AC
  Administered 2022-12-10: 300 mg via ORAL
  Filled 2022-12-10: qty 1

## 2022-12-10 NOTE — ED Triage Notes (Signed)
Pt arrives to ED c/o sharp substernal CP x 3 weeks. Pain usually wakes pt from sleep. Pt also reports left sided flank x 1 day, no strain/trauma/injury reported. Pt w/ hx of kidney failure and receives peritoneal dialysis.

## 2022-12-10 NOTE — ED Notes (Signed)
Patient transported to CT 

## 2022-12-10 NOTE — Discharge Instructions (Addendum)
Evaluation today revealed that you likely have a UTI.  Recommend you continue Omnicef which is the antibiotic you will be taking for UTI and follow-up with your nephrologist.

## 2022-12-10 NOTE — ED Provider Notes (Signed)
[facility] EMERGENCY DEPARTMENT AT [facility] Provider Note   CSN: [id number] Arrival date & time: 12/10/22  0451     History  Chief Complaint  Patient presents with   Chest Pain   Flank Pain   HPI Whitney Bennett is a 37 y.o. female with CKD presenting for chest pain and flank pain.  Chest pain started 3 weeks ago.  Patient endorses point tenderness in the right mid parasternal region.  Denies associated shortness of breath.  Pain is nonradiating and nonpleuritic.  Feels sharp.  Also mention that she started have left sided flank pain a day ago.  Also feels sharp and at times radiates to the front of her abdomen.  Patient reports that she does have a history of kidney failure and receives daily peritoneal dialysis at home.  She is compliant with her dialysis.  Denies OCP use, recent long trips and calf tenderness.  Denies any pain or discomfort with her peritoneal dialysis and states that the dialysis fluid appears clear as he normally does.   Chest Pain Flank Pain Associated symptoms include chest pain.       Home Medications Prior to Admission medications   Medication Sig Start Date End Date Taking? Authorizing Provider  cefdinir (OMNICEF) 300 MG capsule Take 1 capsule (300 mg total) by mouth 2 (two) times daily for 12 days. 12/10/22 12/22/22 Yes Gareth Eagle, PA-C  acetaminophen (TYLENOL) 500 MG tablet Take 500 mg by mouth every 6 (six) hours as needed for mild pain or moderate pain.    [provider]  albuterol (VENTOLIN HFA) 108 (90 Base) MCG/ACT inhaler Inhale 2 puffs into the lungs every 4 (four) hours as needed for wheezing or shortness of breath. 05/20/22   Julieanne Manson, MD  B Complex-C-Folic Acid (RENAL VITAMIN PO) Take by mouth.    [provider]  buPROPion ER (WELLBUTRIN SR) 100 MG 12 hr tablet TAKE 1 TABLET BY MOUTH EVERY 48 HOURS 08/19/22   Julieanne Manson, MD  calcitRIOL (ROCALTROL) 0.25 MCG capsule Take 0.25 mcg by  mouth 2 (two) times daily.    [provider]  Cholecalciferol 25 MCG (1000 UT) tablet Take 1,000 Units by mouth daily. Patient not taking: Reported on 05/18/2021    [provider]  nirmatrelvir/ritonavir (PAXLOVID) 20 x 150 MG & 10 x 100MG  TABS (Take nirmatrelvir 150 mg two tablets by mouth once on day 1 and then once daily for 4 more days and ritonavir 100 mg one tablet daily for 5 days) Patient GFR is 10 05/20/22   Julieanne Manson, MD      Allergies    Lisinopril    Review of Systems   Review of Systems  Cardiovascular:  Positive for chest pain.  Genitourinary:  Positive for flank pain.    Physical Exam   Vitals:   12/10/22 0951 12/10/22 1000  BP:  116/87  Pulse:  65  Resp:  19  Temp: 97.6 F (36.4 C)   SpO2:  100%    CONSTITUTIONAL:  well-appearing, NAD NEURO:  Alert and oriented x 3, CN 3-12 grossly intact EYES:  eyes equal and reactive ENT/NECK:  Supple, no stridor  CARDIO:  regular rate and rhythm, appears well-perfused  PULM:  No respiratory distress, CTAB GI/GU:  non-distended, soft, left CVA tenderness MSK/SPINE:  No gross deformities, no edema, moves all extremities  SKIN:  no rash, atraumatic  *Additional and/or pertinent findings included in MDM below  ED Results / Procedures / Treatments  Labs (all labs ordered are listed, but only abnormal results are displayed) Labs Reviewed  BASIC METABOLIC PANEL - Abnormal; Notable for the following components:      Result Value   Sodium 134 (*)    Potassium 3.1 (*)    CO2 18 (*)    BUN 62 (*)    Creatinine, Ser 14.49 (*)    Calcium 8.5 (*)    GFR, Estimated 3 (*)    Anion gap 16 (*)    All other components within normal limits  CBC - Abnormal; Notable for the following components:   WBC 13.8 (*)    RBC 3.67 (*)    Hemoglobin 10.2 (*)    HCT 31.4 (*)    All other components within normal limits  URINALYSIS, ROUTINE W REFLEX MICROSCOPIC - Abnormal; Notable for the following components:    Color, Urine STRAW (*)    Specific Gravity, Urine 1.004 (*)    Glucose, UA 50 (*)    Hgb urine dipstick MODERATE (*)    Protein, ur 100 (*)    Leukocytes,Ua SMALL (*)    Bacteria, UA RARE (*)    All other components within normal limits  HCG, SERUM, QUALITATIVE  BRAIN NATRIURETIC PEPTIDE  TROPONIN I (HIGH SENSITIVITY)  TROPONIN I (HIGH SENSITIVITY)    EKG EKG Interpretation Date/Time:  Tuesday December 10 2022 04:46:34 EDT Ventricular Rate:  85 PR Interval:  146 QRS Duration:  84 QT Interval:  386 QTC Calculation: 459 R Axis:   14  Text Interpretation: Sinus rhythm with Premature atrial complexes Confirmed by Nicanor Alcon, April ([id number]) on 12/10/2022 6:52:13 AM  Radiology CT ABDOMEN PELVIS WO CONTRAST  Result Date: 12/10/2022 CLINICAL DATA:  Patient on peritoneal dialysis with one day history of left flank pain EXAM: CT ABDOMEN AND PELVIS WITHOUT CONTRAST TECHNIQUE: Multidetector CT imaging of the abdomen and pelvis was performed following the standard protocol without IV contrast. RADIATION DOSE REDUCTION: This exam was performed according to the departmental dose-optimization program which includes automated exposure control, adjustment of the mA and/or kV according to patient size and/or use of iterative reconstruction technique. COMPARISON:  CT abdomen and pelvis dated 02/17/2020 FINDINGS: Lower chest: No focal consolidation or pulmonary nodule in the lung bases. No pleural effusion or pneumothorax demonstrated. Partially imaged heart size is normal. Hepatobiliary: No focal hepatic lesions. No intra or extrahepatic biliary ductal dilation. Normal gallbladder. Pancreas: No focal lesions or main ductal dilation. Spleen: Normal in size without focal abnormality. Adrenals/Urinary Tract: No adrenal nodules. No suspicious renal mass on this noncontrast enhanced examination or hydronephrosis. Bilateral punctate nonobstructing calculi. No focal bladder wall thickening. Stomach/Bowel: Normal  appearance of the stomach. No evidence of bowel wall thickening, distention, or inflammatory changes. Normal appendix. Vascular/Lymphatic: No significant vascular findings are present. No enlarged abdominal or pelvic lymph nodes. Reproductive: No adnexal masses. Other: Left upper quadrant peritoneal dialysis catheter terminates in the right hemipelvis. Small volume free fluid. No free air or fluid collection. Musculoskeletal: No acute or abnormal lytic or blastic osseous lesions. IMPRESSION: 1. Bilateral punctate nonobstructing renal calculi. No hydronephrosis. 2. Left upper quadrant peritoneal dialysis catheter terminates in the right hemipelvis. Small volume free fluid. Electronically Signed   By: Agustin Cree M.D.   On: 12/10/2022 11:40   DG Chest 2 View  Result Date: 12/10/2022 CLINICAL DATA:  Chest pain EXAM: CHEST - 2 VIEW COMPARISON:  02/17/2020 FINDINGS: Normal heart size and mediastinal contours. No acute infiltrate or edema. No effusion or pneumothorax. No acute osseous  findings. IMPRESSION: No active cardiopulmonary disease. Electronically Signed   By: Tiburcio Pea M.D.   On: 12/10/2022 05:28    Procedures Procedures    Medications Ordered in ED Medications  cefdinir (OMNICEF) capsule 300 mg (has no administration in time range)  ondansetron (ZOFRAN) injection 4 mg (4 mg Intravenous Given 12/10/22 1029)  HYDROmorphone (DILAUDID) injection 0.5 mg (0.5 mg Intravenous Given 12/10/22 1032)    ED Course/ Medical Decision Making/ A&P                                 Medical Decision Making Amount and/or Complexity of Data Reviewed Labs: ordered. Radiology: ordered.  Risk Prescription drug management.   Initial Impression and Ddx 37 year old well-appearing female presenting for flank and chest pain.  Exam notable for left-sided CVA tenderness.  DDx includes catheter associated infection, pyelonephritis, UTI, nephrolithiasis, other intra-abdominal infection, ACS, PE. Patient PMH that  increases complexity of ED encounter: CKD  Interpretation of Diagnostics - I independent reviewed and interpreted the labs as followed: Leukocytosis, pyuria, bacteriuria, reduced GFR  - I independently visualized the following imaging with scope of interpretation limited to determining acute life threatening conditions related to emergency care: CT ab/pel non con, which revealed punctate nonobstructive bilateral renal calculi, distal catheter right hemipelvis with small amount of free fluid at the tip.  Chest x-ray without acute findings.  - I personally reviewed interpret EKG which revealed sinus rhythm  Patient Reassessment and Ultimate Disposition/Management On reassessment, patient stated pain was improved.  CT scan was unremarkable but labs somewhat concerning for UTI versus catheter associated infection.  Discussed patient with Dr. Arlean Hopping of nephrology who advised that since she has had no associated symptoms regarding a possible catheter infection and CT scan reassuring that it is likely a UTI.  Advised to treat and follow-up with her nephrologist Dr. Sabra Heck.  Return precautions.  Vital stable.  Discharged home in good condition.  Patient management required discussion with the following services or consulting groups:  Nephrology  Complexity of Problems Addressed Acute complicated illness or Injury  Additional Data Reviewed and Analyzed Further history obtained from: Further history from spouse/family member, Past medical history and medications listed in the EMR, and Prior ED visit notes  Patient Encounter Risk Assessment Prescriptions and Consideration of hospitalization         Final Clinical Impression(s) / ED Diagnoses Final diagnoses:  Urinary tract infection with hematuria, site unspecified    Rx / DC Orders ED Discharge Orders          Ordered    cefdinir (OMNICEF) 300 MG capsule  2 times daily        12/10/22 1322              Gareth Eagle,  PA-C 12/10/22 1332    Lonell Grandchild, MD 12/11/22 [phone]

## 2023-02-13 ENCOUNTER — Telehealth: Payer: Self-pay

## 2023-02-13 NOTE — Telephone Encounter (Signed)
Patient would like to be on wait list for a CPE appointment.  We will call patient if there is cancellation.

## 2023-02-17 ENCOUNTER — Ambulatory Visit: Payer: 59 | Admitting: Internal Medicine

## 2023-02-17 ENCOUNTER — Encounter: Payer: Self-pay | Admitting: Internal Medicine

## 2023-02-17 VITALS — BP 122/90 | HR 98 | Resp 16 | Ht 66.0 in | Wt 212.0 lb

## 2023-02-17 DIAGNOSIS — M25552 Pain in left hip: Secondary | ICD-10-CM

## 2023-02-17 DIAGNOSIS — G8929 Other chronic pain: Secondary | ICD-10-CM

## 2023-02-17 DIAGNOSIS — N184 Chronic kidney disease, stage 4 (severe): Secondary | ICD-10-CM

## 2023-02-17 DIAGNOSIS — R252 Cramp and spasm: Secondary | ICD-10-CM

## 2023-02-17 DIAGNOSIS — M25562 Pain in left knee: Secondary | ICD-10-CM

## 2023-02-17 DIAGNOSIS — M25551 Pain in right hip: Secondary | ICD-10-CM

## 2023-02-17 DIAGNOSIS — M25561 Pain in right knee: Secondary | ICD-10-CM

## 2023-02-17 DIAGNOSIS — F419 Anxiety disorder, unspecified: Secondary | ICD-10-CM

## 2023-02-17 DIAGNOSIS — F32A Depression, unspecified: Secondary | ICD-10-CM

## 2023-02-17 MED ORDER — CLONAZEPAM 1 MG PO TABS: ORAL_TABLET | ORAL | 0 refills | Status: DC

## 2023-02-17 NOTE — Progress Notes (Signed)
Subjective:    Patient ID: Whitney Bennett, female   DOB: May 01, 1985, 37 y.o.   MRN: [id number]   HPI   Sharp groin, knee and ankle pain when tries to sleep at night for past 4 months.  Started out less frequently and has gradually become a daily problem.  Also gets a "shocking" pain in her muscles of calves and shins.  Feels a soreness in her shins when massages them.  She does get muscle cramps of medial thighs at times as well.  She has a magnesium spray on her muscles when she has the cramps, which relieves the cramp quickly.   When gets the shocking pain sensation, she gets an urge to urinate.  She will get the discomforts during the day if naps more than 30-45 minutes.   No definite swelling of ankle joints, but she does believe her knees develop effusions, no redness, though feel sore where the fluid is and perhaps with increased warmth.   No family history of SLE or RA   No swelling of fingers or hands.   Had labs performed with renal 2 weeks ago.   Her potassium and calcium were fine and her phosphorus was elevated above 9 normal max at 5.5.   PTH was normal at 308 BUN was 83. She was placed on a new phosphorus binder, Tenapanor, which she is starting today after getting home.   Dr. Marisue Humble is her nephrologist. Also having bruising on legs.  Stopped heparin due to bruising and did decrease inititally Also, stopped bupropion for 2 weeks without change, save for increased anxiety.   [facility] is also setting her up with counseling again as well.    Current Meds  Medication Sig   acetaminophen (TYLENOL) 500 MG tablet Take 500 mg by mouth every 6 (six) hours as needed for mild pain or moderate pain.   B Complex-C-Folic Acid (RENAL VITAMIN PO) Take by mouth.   buPROPion ER (WELLBUTRIN SR) 100 MG 12 hr tablet TAKE 1 TABLET BY MOUTH EVERY 48 HOURS   calcitRIOL (ROCALTROL) 0.25 MCG capsule Take 0.25 mcg by mouth 2 (two) times daily.   Cholecalciferol 25 MCG (1000 UT) tablet Take  1,000 Units by mouth daily. Taking   gentamicin cream (GARAMYCIN) 0.1 % Apply 1 Application topically daily.   sevelamer carbonate (RENVELA) 2.4 g PACK Take 2.4 g by mouth in the morning, at noon, and at bedtime.   Tenapanor HCl, CKD, 30 MG TABS Take 30 mg by mouth 2 (two) times daily.   Allergies  Allergen Reactions   Lisinopril Cough     Review of Systems    Objective:   BP (!) 122/90 (BP Location: Left Arm, Patient Position: Sitting, Cuff Size: Normal)   Pulse 98   Resp 16   Ht 5\' 6"  (1.676 m)   Wt 212 lb (96.2 kg)   LMP 01/20/2023 (Approximate)   BMI 34.22 kg/m   Physical Exam NAD MS:  no swelling, erythema or pain of upper body joints Bilateral knees possibly with some swelling above knee, not clear if an actual effusion.   No swelling or redness of ankles Tender along entire knee joint line.  No ligamentous laxity or pain with stress--collaterals or cruciates save for medial collateral on left knee.       Assessment & Plan   Anxiety:  Restart wellbutrin.  Will prescribe small amount  #10 of clonazepam for increased anxiety as gets restarted and mainly to take at bedtime.  0.5 to 1 mg.    Joint swelling/pain and soft tissue discomfort/muscle cramps:  Electrolytes recently performed by renal appear to have been okay save for phosphorus.  Call into Dr. Lily Lovings office/peritoneal office to see if okay to use NSAIDS:  ultimate response was no.  CRP, Sed Rate, ANA, RF.  Tylenol for pain for now.

## 2023-02-20 LAB — SEDIMENTATION RATE: Sed Rate: 16 mm/h (ref 0–32)

## 2023-02-20 LAB — FANA STAINING PATTERNS: Speckled Pattern: 1:160 {titer} — ABNORMAL HIGH

## 2023-02-20 LAB — RHEUMATOID FACTOR: Rheumatoid fact SerPl-aCnc: <10 [IU]/mL (ref <14.0)

## 2023-02-20 LAB — C-REACTIVE PROTEIN: CRP: 3 mg/L (ref 0–10)

## 2023-02-20 LAB — ANTINUCLEAR ANTIBODIES, IFA

## 2023-02-26 ENCOUNTER — Ambulatory Visit (HOSPITAL_COMMUNITY)
Admission: RE | Admit: 2023-02-26 | Discharge: 2023-02-26 | Disposition: A | Discharge status: Home / Self Care | Payer: Managed Care, Other (non HMO) | Source: Ambulatory Visit | Attending: Internal Medicine | Admitting: Internal Medicine

## 2023-02-26 ENCOUNTER — Encounter (HOSPITAL_COMMUNITY): Payer: Self-pay

## 2023-02-26 ENCOUNTER — Ambulatory Visit (HOSPITAL_COMMUNITY)
Admission: EM | Admit: 2023-02-26 | Discharge: 2023-02-26 | Disposition: A | Discharge status: Home / Self Care | Payer: Managed Care, Other (non HMO) | Attending: Emergency Medicine | Admitting: Emergency Medicine

## 2023-02-26 DIAGNOSIS — M79662 Pain in left lower leg: Secondary | ICD-10-CM

## 2023-02-26 DIAGNOSIS — M7989 Other specified soft tissue disorders: Secondary | ICD-10-CM

## 2023-02-26 NOTE — Discharge Instructions (Signed)
[facility] Appointment for 3 pm, left LE venous

## 2023-02-26 NOTE — ED Triage Notes (Signed)
Patient with c/o left calf swelling, tenderness, and hot to the touch. States she called her dialysis nurse and was told to come to check for blood clot. Patient peritoneal dialysis patient on transplant list at [facility].

## 2023-02-26 NOTE — ED Provider Notes (Signed)
[facility]    CSN: [id number] Arrival date & time: 02/26/23  1244     History   Chief Complaint Chief Complaint  Patient presents with   Leg Swelling    HPI Shallan Cindric is a 37 y.o. female.  Left calf swelling, tenderness, and warmth x 3 days  Swelling went down today with compression stockings. Reports it was twice as big yesterday Called her dialysis nurse who advised urgent care eval ESRD awaiting transplant  No history of DVT or PE  Reports BP has been elevated the last few days which is abnormal for her. In clinic today 158/91  Past Medical History:  Diagnosis Date   Anxiety    Chronic kidney disease    with proteinuria Focal Segvnmental Glomerulosclerosis   Depression     Patient Active Problem List   Diagnosis Date Noted   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) 12/03/2020   Fever    COVID-19    Acute renal failure (HCC) 02/17/2020   Mixed hyperlipidemia 08/24/2018   Anxiety and depression 08/24/2018   CKD (chronic kidney disease) stage 4, GFR 15-29 ml/min (HCC)    Breast tenderness in female 11/14/2015   MONILIAL VAGINITIS 09/20/2008   Subjective visual disturbance 09/20/2008   Hearing loss 09/20/2008   ABSCESS, TOOTH 06/10/2008   URINARY TRACT INFECTION, RECURRENT 06/10/2008   OTH D/O MENSTRUATION&OTH ABN BLEED FE GNT TRACT 06/10/2008    Past Surgical History:  Procedure Laterality Date   CAPD INSERTION N/A 04/18/2021   Procedure: LAPAROSCOPIC INSERTION CONTINUOUS AMBULATORY PERITONEAL DIALYSIS  (CAPD) CATHETER WITH OMENTOPEXY;  Surgeon: Leonie Douglas, MD;  Location: [facility];  Service: Vascular;  Laterality: N/A;   NO PAST SURGERIES      OB History     Gravida  2   Para  1   Term  1   Preterm      AB  0   Living  2      SAB      IAB      Ectopic  0   Multiple      Live Births  1            Home Medications    Prior to Admission medications   Medication Sig Start Date End  Date Taking? Authorizing Provider  acetaminophen (TYLENOL) 500 MG tablet Take 500 mg by mouth every 6 (six) hours as needed for mild pain or moderate pain.    [provider]  albuterol (VENTOLIN HFA) 108 (90 Base) MCG/ACT inhaler Inhale 2 puffs into the lungs every 4 (four) hours as needed for wheezing or shortness of breath. Patient not taking: Reported on 02/17/2023 05/20/22   Julieanne Manson, MD  B Complex-C-Folic Acid (RENAL VITAMIN PO) Take by mouth.    [provider]  buPROPion ER (WELLBUTRIN SR) 100 MG 12 hr tablet TAKE 1 TABLET BY MOUTH EVERY 48 HOURS 08/19/22   Julieanne Manson, MD  calcitRIOL (ROCALTROL) 0.25 MCG capsule Take 0.25 mcg by mouth 2 (two) times daily.    [provider]  Cholecalciferol 25 MCG (1000 UT) tablet Take 1,000 Units by mouth daily. Taking    [provider]  clonazePAM (KLONOPIN) 1 MG tablet 1/2 to 1 tab by mouth at bedtime as needed for sleep 02/17/23   Julieanne Manson, MD  gentamicin cream (GARAMYCIN) 0.1 % Apply 1 Application topically daily.    [provider]  nirmatrelvir/ritonavir (PAXLOVID) 20 x 150 MG & 10  x 100MG  TABS (Take nirmatrelvir 150 mg two tablets by mouth once on day 1 and then once daily for 4 more days and ritonavir 100 mg one tablet daily for 5 days) Patient GFR is 10 Patient not taking: Reported on 02/17/2023 05/20/22   Julieanne Manson, MD  sevelamer carbonate (RENVELA) 2.4 g PACK Take 2.4 g by mouth in the morning, at noon, and at bedtime. 10/08/21   [provider]  Tenapanor HCl, CKD, 30 MG TABS Take 30 mg by mouth 2 (two) times daily.    [provider]    Family History Family History  Problem Relation Age of Onset   Diabetes Mother    Thyroid disease Mother    Cervical polyp Mother    Post-traumatic stress disorder Mother    Hypercholesterolemia Father    Depression Father    Hypertension Father    Heart disease Brother    Diabetes Maternal  Grandmother    Diabetes Maternal Grandfather    Stroke Maternal Grandfather    Diabetes Paternal Grandmother    Alcohol abuse Paternal Grandfather    Alcoholism Paternal Grandfather    Other Neg Hx     Social History Social History   Tobacco Use   Smoking status: Never   Smokeless tobacco: Never  Vaping Use   Vaping status: Never Used  Substance Use Topics   Alcohol use: Not Currently    Comment: quit 2019.  Previously occasionally   Drug use: No     Allergies   Lisinopril   Review of Systems Review of Systems Per HPI  Physical Exam Triage Vital Signs ED Triage Vitals  Encounter Vitals Group     BP      Systolic BP Percentile      Diastolic BP Percentile      Pulse      Resp      Temp      Temp src      SpO2      Weight      Height      Head Circumference      Peak Flow      Pain Score      Pain Loc      Pain Education      Exclude from Growth Chart    No data found.  Updated Vital Signs BP (!) 158/91 (BP Location: Left Arm)   Pulse 85   Temp (!) 97.2 F (36.2 C) (Oral)   Resp 18   LMP 01/20/2023 (Approximate)   SpO2 100%   Physical Exam Vitals and nursing note reviewed.  Constitutional:      General: She is not in acute distress. Cardiovascular:     Rate and Rhythm: Normal rate and regular rhythm.     Pulses: Normal pulses.     Heart sounds: Normal heart sounds.  Pulmonary:     Effort: Pulmonary effort is normal.     Breath sounds: Normal breath sounds.  Musculoskeletal:        General: Tenderness present.     Cervical back: Normal range of motion.     Left lower leg: Tenderness present.       Legs:     Comments: Posterior lower leg tenderness, left. No obvious swelling. Distal sensation intact. Strong DP pulses. Cap refill < 2 seconds   Skin:    General: Skin is warm and dry.     Capillary Refill: Capillary refill takes less than 2 seconds.     Findings: No  bruising, erythema or rash.  Neurological:     Mental Status: She is  alert and oriented to person, place, and time.     UC Treatments / Results  Labs (all labs ordered are listed, but only abnormal results are displayed) Labs Reviewed - No data to display  EKG  Radiology No results found.  Procedures Procedures  Medications Ordered in UC Medications - No data to display  Initial Impression / Assessment and Plan / UC Course  I have reviewed the triage vital signs and the nursing notes.  Pertinent labs & imaging results that were available during my care of the patient were reviewed by me and considered in my medical decision making (see chart for details).  Ordered ultrasound of left lower extremity for DVT rule out. Will be referred to DVT clinic if positive findings. Sent to [facility] for imaging appointment.   Final Clinical Impressions(s) / UC Diagnoses   Final diagnoses:  Pain and swelling of lower leg, left     Discharge Instructions      [facility] Appointment for 3 pm, left LE venous      ED Prescriptions   None    PDMP not reviewed this encounter.   Marlow Baars, [place] 02/26/23 1510

## 2023-03-10 ENCOUNTER — Inpatient Hospital Stay (HOSPITAL_COMMUNITY)
Admission: EM | Admit: 2023-03-10 | Discharge: 2023-03-12 | DRG: 643 | Disposition: A | Discharge status: Home / Self Care | Payer: Managed Care, Other (non HMO) | Attending: Infectious Diseases | Admitting: Infectious Diseases

## 2023-03-10 ENCOUNTER — Encounter (HOSPITAL_COMMUNITY): Payer: Self-pay | Admitting: *Deleted

## 2023-03-10 ENCOUNTER — Emergency Department (HOSPITAL_COMMUNITY): Payer: Managed Care, Other (non HMO)

## 2023-03-10 ENCOUNTER — Other Ambulatory Visit: Payer: Self-pay

## 2023-03-10 DIAGNOSIS — Z8249 Family history of ischemic heart disease and other diseases of the circulatory system: Secondary | ICD-10-CM

## 2023-03-10 DIAGNOSIS — N186 End stage renal disease: Secondary | ICD-10-CM | POA: Diagnosis present

## 2023-03-10 DIAGNOSIS — Z91158 Patient's noncompliance with renal dialysis for other reason: Secondary | ICD-10-CM

## 2023-03-10 DIAGNOSIS — J210 Acute bronchiolitis due to respiratory syncytial virus: Secondary | ICD-10-CM | POA: Diagnosis not present

## 2023-03-10 DIAGNOSIS — E871 Hypo-osmolality and hyponatremia: Secondary | ICD-10-CM | POA: Diagnosis not present

## 2023-03-10 DIAGNOSIS — Z823 Family history of stroke: Secondary | ICD-10-CM

## 2023-03-10 DIAGNOSIS — D631 Anemia in chronic kidney disease: Secondary | ICD-10-CM | POA: Diagnosis present

## 2023-03-10 DIAGNOSIS — Z8349 Family history of other endocrine, nutritional and metabolic diseases: Secondary | ICD-10-CM

## 2023-03-10 DIAGNOSIS — Z833 Family history of diabetes mellitus: Secondary | ICD-10-CM

## 2023-03-10 DIAGNOSIS — N2581 Secondary hyperparathyroidism of renal origin: Secondary | ICD-10-CM | POA: Diagnosis present

## 2023-03-10 DIAGNOSIS — N051 Unspecified nephritic syndrome with focal and segmental glomerular lesions: Secondary | ICD-10-CM | POA: Diagnosis present

## 2023-03-10 DIAGNOSIS — E8770 Fluid overload, unspecified: Secondary | ICD-10-CM | POA: Diagnosis present

## 2023-03-10 DIAGNOSIS — Z992 Dependence on renal dialysis: Secondary | ICD-10-CM

## 2023-03-10 DIAGNOSIS — R1310 Dysphagia, unspecified: Secondary | ICD-10-CM | POA: Diagnosis present

## 2023-03-10 DIAGNOSIS — Z83438 Family history of other disorder of lipoprotein metabolism and other lipidemia: Secondary | ICD-10-CM

## 2023-03-10 DIAGNOSIS — Z888 Allergy status to other drugs, medicaments and biological substances status: Secondary | ICD-10-CM

## 2023-03-10 DIAGNOSIS — Z79899 Other long term (current) drug therapy: Secondary | ICD-10-CM

## 2023-03-10 DIAGNOSIS — R5381 Other malaise: Secondary | ICD-10-CM | POA: Diagnosis present

## 2023-03-10 DIAGNOSIS — E222 Syndrome of inappropriate secretion of antidiuretic hormone: Secondary | ICD-10-CM | POA: Diagnosis not present

## 2023-03-10 DIAGNOSIS — Z818 Family history of other mental and behavioral disorders: Secondary | ICD-10-CM

## 2023-03-10 DIAGNOSIS — Z6835 Body mass index (BMI) 35.0-35.9, adult: Secondary | ICD-10-CM

## 2023-03-10 DIAGNOSIS — Z7682 Awaiting organ transplant status: Secondary | ICD-10-CM

## 2023-03-10 LAB — BASIC METABOLIC PANEL
Anion gap: 19 — ABNORMAL HIGH (ref 5–15)
Anion gap: 18 — ABNORMAL HIGH (ref 5–15)
Anion gap: 17 — ABNORMAL HIGH (ref 5–15)
BUN: 87 mg/dL — ABNORMAL HIGH (ref 6–20)
BUN: 89 mg/dL — ABNORMAL HIGH (ref 6–20)
BUN: 86 mg/dL — ABNORMAL HIGH (ref 6–20)
CO2: 15 mmol/L — ABNORMAL LOW (ref 22–32)
CO2: 15 mmol/L — ABNORMAL LOW (ref 22–32)
CO2: 18 mmol/L — ABNORMAL LOW (ref 22–32)
Calcium: 8 mg/dL — ABNORMAL LOW (ref 8.9–10.3)
Calcium: 7.9 mg/dL — ABNORMAL LOW (ref 8.9–10.3)
Calcium: 8.4 mg/dL — ABNORMAL LOW (ref 8.9–10.3)
Chloride: 84 mmol/L — ABNORMAL LOW (ref 98–111)
Chloride: 86 mmol/L — ABNORMAL LOW (ref 98–111)
Chloride: 89 mmol/L — ABNORMAL LOW (ref 98–111)
Creatinine, Ser: 14.17 mg/dL — ABNORMAL HIGH (ref 0.44–1.00)
Creatinine, Ser: 14.45 mg/dL — ABNORMAL HIGH (ref 0.44–1.00)
Creatinine, Ser: 14.86 mg/dL — ABNORMAL HIGH (ref 0.44–1.00)
GFR, Estimated: 3 mL/min — ABNORMAL LOW (ref >60)
GFR, Estimated: 3 mL/min — ABNORMAL LOW (ref >60)
GFR, Estimated: 3 mL/min — ABNORMAL LOW (ref >60)
Glucose, Bld: 85 mg/dL (ref 70–99)
Glucose, Bld: 145 mg/dL — ABNORMAL HIGH (ref 70–99)
Glucose, Bld: 95 mg/dL (ref 70–99)
Potassium: 3.5 mmol/L (ref 3.5–5.1)
Potassium: 3.6 mmol/L (ref 3.5–5.1)
Potassium: 3.5 mmol/L (ref 3.5–5.1)
Sodium: 118 mmol/L — CL (ref 135–145)
Sodium: 119 mmol/L — CL (ref 135–145)
Sodium: 124 mmol/L — ABNORMAL LOW (ref 135–145)

## 2023-03-10 LAB — CBC WITH DIFFERENTIAL/PLATELET
Abs Immature Granulocytes: 0.02 10*3/uL (ref 0.00–0.07)
Basophils Absolute: 0.1 10*3/uL (ref 0.0–0.1)
Basophils Relative: 1 %
Eosinophils Absolute: 0.2 10*3/uL (ref 0.0–0.5)
Eosinophils Relative: 3 %
HCT: 31 % — ABNORMAL LOW (ref 36.0–46.0)
Hemoglobin: 10.7 g/dL — ABNORMAL LOW (ref 12.0–15.0)
Immature Granulocytes: 0 %
Lymphocytes Relative: 22 %
Lymphs Abs: 1.8 10*3/uL (ref 0.7–4.0)
MCH: 28.8 pg (ref 26.0–34.0)
MCHC: 34.5 g/dL (ref 30.0–36.0)
MCV: 83.6 fL (ref 80.0–100.0)
Monocytes Absolute: 0.7 10*3/uL (ref 0.1–1.0)
Monocytes Relative: 9 %
Neutro Abs: 5.3 10*3/uL (ref 1.7–7.7)
Neutrophils Relative %: 65 %
Platelets: 268 10*3/uL (ref 150–400)
RBC: 3.71 MIL/uL — ABNORMAL LOW (ref 3.87–5.11)
RDW: 13.3 % (ref 11.5–15.5)
WBC: 8.1 10*3/uL (ref 4.0–10.5)
nRBC: 0 % (ref 0.0–0.2)

## 2023-03-10 LAB — COMPREHENSIVE METABOLIC PANEL
ALT: 35 U/L (ref 0–44)
AST: 38 U/L (ref 15–41)
Albumin: 3.4 g/dL — ABNORMAL LOW (ref 3.5–5.0)
Alkaline Phosphatase: 53 U/L (ref 38–126)
Anion gap: 15 (ref 5–15)
BUN: 87 mg/dL — ABNORMAL HIGH (ref 6–20)
CO2: 18 mmol/L — ABNORMAL LOW (ref 22–32)
Calcium: 8 mg/dL — ABNORMAL LOW (ref 8.9–10.3)
Chloride: 87 mmol/L — ABNORMAL LOW (ref 98–111)
Creatinine, Ser: 14.84 mg/dL — ABNORMAL HIGH (ref 0.44–1.00)
GFR, Estimated: 3 mL/min — ABNORMAL LOW (ref >60)
Glucose, Bld: 92 mg/dL (ref 70–99)
Potassium: 3.7 mmol/L (ref 3.5–5.1)
Sodium: 120 mmol/L — ABNORMAL LOW (ref 135–145)
Total Bilirubin: 0.3 mg/dL (ref <1.2)
Total Protein: 6.6 g/dL (ref 6.5–8.1)

## 2023-03-10 LAB — URINALYSIS, W/ REFLEX TO CULTURE (INFECTION SUSPECTED)
Bilirubin Urine: NEGATIVE
Glucose, UA: 50 mg/dL — AB
Ketones, ur: NEGATIVE mg/dL
Nitrite: NEGATIVE
Protein, ur: 100 mg/dL — AB
Specific Gravity, Urine: 1.004 — ABNORMAL LOW (ref 1.005–1.030)
pH: 7 (ref 5.0–8.0)

## 2023-03-10 LAB — RESP PANEL BY RT-PCR (RSV, FLU A&B, COVID)  RVPGX2
Influenza A by PCR: NEGATIVE
Influenza B by PCR: NEGATIVE
Resp Syncytial Virus by PCR: POSITIVE — AB
SARS Coronavirus 2 by RT PCR: NEGATIVE

## 2023-03-10 LAB — TSH: TSH: 1.573 u[IU]/mL (ref 0.350–4.500)

## 2023-03-10 LAB — SODIUM, URINE, RANDOM: Sodium, Ur: 32 mmol/L

## 2023-03-10 LAB — OSMOLALITY, URINE: Osmolality, Ur: 146 mosm/kg — ABNORMAL LOW (ref 300–900)

## 2023-03-10 LAB — OSMOLALITY: Osmolality: 284 mosm/kg (ref 275–295)

## 2023-03-10 LAB — PHOSPHORUS: Phosphorus: 11.2 mg/dL — ABNORMAL HIGH (ref 2.5–4.6)

## 2023-03-10 LAB — LIPID PANEL
Cholesterol: 186 mg/dL (ref 0–200)
HDL: 50 mg/dL (ref >40)
LDL Cholesterol: 116 mg/dL — ABNORMAL HIGH (ref 0–99)
Total CHOL/HDL Ratio: 3.7 {ratio}
Triglycerides: 100 mg/dL (ref <150)
VLDL: 20 mg/dL (ref 0–40)

## 2023-03-10 LAB — TROPONIN I (HIGH SENSITIVITY)
Troponin I (High Sensitivity): 4 ng/L (ref <18)
Troponin I (High Sensitivity): 4 ng/L (ref <18)

## 2023-03-10 LAB — MAGNESIUM: Magnesium: 1.9 mg/dL (ref 1.7–2.4)

## 2023-03-10 LAB — HCG, SERUM, QUALITATIVE: Preg, Serum: NEGATIVE

## 2023-03-10 MED ORDER — TOLVAPTAN 15 MG PO TABS
15.0000 mg | ORAL_TABLET | Freq: Once | ORAL | Status: DC
  Filled 2023-03-10 (×2): qty 1

## 2023-03-10 MED ORDER — ACETAMINOPHEN 325 MG PO TABS: 650.0000 mg | ORAL_TABLET | Freq: Four times a day (QID) | ORAL | Status: DC | PRN

## 2023-03-10 MED ORDER — HEPARIN SODIUM (PORCINE) 5000 UNIT/ML IJ SOLN
5000.0000 [IU] | Freq: Three times a day (TID) | INTRAMUSCULAR | Status: DC
  Administered 2023-03-10 – 2023-03-12 (×4): 5000 [IU] via SUBCUTANEOUS
  Filled 2023-03-10 (×5): qty 1

## 2023-03-10 MED ORDER — GUAIFENESIN ER 600 MG PO TB12
600.0000 mg | ORAL_TABLET | Freq: Two times a day (BID) | ORAL | Status: DC | PRN
  Administered 2023-03-10 – 2023-03-11 (×2): 600 mg via ORAL
  Filled 2023-03-10 (×2): qty 1

## 2023-03-10 MED ORDER — BUPROPION HCL 100 MG PO TABS
100.0000 mg | ORAL_TABLET | ORAL | Status: DC
  Administered 2023-03-10 – 2023-03-12 (×2): 100 mg via ORAL
  Filled 2023-03-10 (×2): qty 1

## 2023-03-10 MED ORDER — SEVELAMER CARBONATE 2.4 G PO PACK
2.4000 g | PACK | Freq: Three times a day (TID) | ORAL | Status: DC
  Administered 2023-03-10 – 2023-03-12 (×5): 2.4 g via ORAL
  Filled 2023-03-10 (×8): qty 1

## 2023-03-10 MED ORDER — GENTAMICIN SULFATE 0.1 % EX CREA
1.0000 | TOPICAL_CREAM | Freq: Every day | CUTANEOUS | Status: DC
  Filled 2023-03-10 (×2): qty 15

## 2023-03-10 MED ORDER — CALCITRIOL 0.25 MCG PO CAPS
0.2500 ug | ORAL_CAPSULE | Freq: Two times a day (BID) | ORAL | Status: DC
  Administered 2023-03-10 – 2023-03-12 (×5): 0.25 ug via ORAL
  Filled 2023-03-10 (×6): qty 1

## 2023-03-10 MED ORDER — ACETAMINOPHEN 650 MG RE SUPP: 650.0000 mg | Freq: Four times a day (QID) | RECTAL | Status: DC | PRN

## 2023-03-10 NOTE — Progress Notes (Signed)
Patient arrived to room 3E08, husband at bedside. Patient oriented to room, CHG completed.

## 2023-03-10 NOTE — ED Provider Notes (Signed)
[facility] Provider Note   CSN: [id number] Arrival date & time: 03/10/23  0531     History  Chief Complaint  Patient presents with   Cough   Shortness of Breath    Whitney Bennett is a 37 y.o. female with PMHx CKD on peritoneal dialysis who presents to ED concerned for SOB, itching throat, cough, congestion, [place] x2 days. Patient's child is also sick at home with viral illness. Patient has tried hot tea, vaporizer, albuterol inhaler, which has provided minimal relief. Patient was not able to finish peritoneal dialysis yesterday d/t symptoms. Also concerned for restlessness and mild muscle spasms. Patient also admits to low sodium diet.  Denies vomiting, diarrhea. Denies headaches, confusion, seizures.   Cough Associated symptoms: shortness of breath   Shortness of Breath Associated symptoms: cough        Home Medications Prior to Admission medications   Medication Sig Start Date End Date Taking? Authorizing Provider  acetaminophen (TYLENOL) 500 MG tablet Take 500 mg by mouth every 6 (six) hours as needed for mild pain or moderate pain.    [provider]  albuterol (VENTOLIN HFA) 108 (90 Base) MCG/ACT inhaler Inhale 2 puffs into the lungs every 4 (four) hours as needed for wheezing or shortness of breath. Patient not taking: Reported on 02/17/2023 05/20/22   Julieanne Manson, MD  B Complex-C-Folic Acid (RENAL VITAMIN PO) Take by mouth.    [provider]  buPROPion ER (WELLBUTRIN SR) 100 MG 12 hr tablet TAKE 1 TABLET BY MOUTH EVERY 48 HOURS 08/19/22   Julieanne Manson, MD  calcitRIOL (ROCALTROL) 0.25 MCG capsule Take 0.25 mcg by mouth 2 (two) times daily.    [provider]  Cholecalciferol 25 MCG (1000 UT) tablet Take 1,000 Units by mouth daily. Taking    [provider]  clonazePAM (KLONOPIN) 1 MG tablet 1/2 to 1 tab by mouth at bedtime as needed for sleep 02/17/23    Julieanne Manson, MD  gentamicin cream (GARAMYCIN) 0.1 % Apply 1 Application topically daily.    [provider]  nirmatrelvir/ritonavir (PAXLOVID) 20 x 150 MG & 10 x 100MG  TABS (Take nirmatrelvir 150 mg two tablets by mouth once on day 1 and then once daily for 4 more days and ritonavir 100 mg one tablet daily for 5 days) Patient GFR is 10 Patient not taking: Reported on 02/17/2023 05/20/22   Julieanne Manson, MD  sevelamer carbonate (RENVELA) 2.4 g PACK Take 2.4 g by mouth in the morning, at noon, and at bedtime. 10/08/21   [provider]  Tenapanor HCl, CKD, 30 MG TABS Take 30 mg by mouth 2 (two) times daily.    [provider]      Allergies    Lisinopril    Review of Systems   Review of Systems  Respiratory:  Positive for cough and shortness of breath.     Physical Exam Updated Vital Signs BP 126/84   Pulse 83   Temp 97.9 F (36.6 C) (Oral)   Resp (!) 22   Ht 5\' 6"  (1.676 m)   Wt 94.3 kg   LMP 01/20/2023 (Approximate)   SpO2 100%   BMI 33.57 kg/m  Physical Exam Vitals and nursing note reviewed.  Constitutional:      General: She is not in acute distress.    Appearance: She is not ill-appearing or toxic-appearing.  HENT:     Head: Normocephalic and atraumatic.     Mouth/Throat:  Mouth: Mucous membranes are moist.     Pharynx: No oropharyngeal exudate or posterior oropharyngeal erythema.  Eyes:     General: No scleral icterus.       Right eye: No discharge.        Left eye: No discharge.     Conjunctiva/sclera: Conjunctivae normal.  Cardiovascular:     Rate and Rhythm: Normal rate and regular rhythm.     Pulses: Normal pulses.     Heart sounds: No murmur heard. Pulmonary:     Effort: Pulmonary effort is normal. No respiratory distress.     Breath sounds: Normal breath sounds. No wheezing, rhonchi or rales.  Abdominal:     General: Bowel sounds are normal.     Palpations: Abdomen is soft. There is no mass.     Tenderness:  There is no abdominal tenderness.  Musculoskeletal:     Right lower leg: No edema.     Left lower leg: No edema.  Skin:    General: Skin is warm and dry.     Findings: No rash.  Neurological:     General: No focal deficit present.     Mental Status: She is alert and oriented to person, place, and time. Mental status is at baseline.     Comments: GCS 15. Speech is goal oriented. No deficits appreciated to CN III-XII; symmetric eyebrow raise, no facial drooping, tongue midline. Patient has equal grip strength bilaterally with 5/5 strength against resistance in all major muscle groups bilaterally. Sensation to light touch intact. Patient moves extremities without ataxia.     Psychiatric:        Mood and Affect: Mood normal.     ED Results / Procedures / Treatments   Labs (all labs ordered are listed, but only abnormal results are displayed) Labs Reviewed  RESP PANEL BY RT-PCR (RSV, FLU A&B, COVID)  RVPGX2 - Abnormal; Notable for the following components:      Result Value   Resp Syncytial Virus by PCR POSITIVE (*)    All other components within normal limits  CBC WITH DIFFERENTIAL/PLATELET - Abnormal; Notable for the following components:   RBC 3.71 (*)    Hemoglobin 10.7 (*)    HCT 31.0 (*)    All other components within normal limits  COMPREHENSIVE METABOLIC PANEL - Abnormal; Notable for the following components:   Sodium 120 (*)    Chloride 87 (*)    CO2 18 (*)    BUN 87 (*)    Creatinine, Ser 14.84 (*)    Calcium 8.0 (*)    Albumin 3.4 (*)    GFR, Estimated 3 (*)    All other components within normal limits  HCG, SERUM, QUALITATIVE  TSH  SODIUM, URINE, RANDOM  MAGNESIUM  PHOSPHORUS  OSMOLALITY  URINALYSIS, W/ REFLEX TO CULTURE (INFECTION SUSPECTED)  LEGIONELLA PNEUMOPHILA SEROGP 1 UR AG  OSMOLALITY, URINE  BASIC METABOLIC PANEL  BASIC METABOLIC PANEL  BASIC METABOLIC PANEL  TROPONIN I (HIGH SENSITIVITY)    EKG None  Radiology DG Chest 2 View Result Date:  03/10/2023 CLINICAL DATA:  Cough and shortness of breath. EXAM: CHEST - 2 VIEW COMPARISON:  12/10/2022 FINDINGS: Heart size and mediastinal contours are unremarkable. There is no pleural fluid, interstitial edema or airspace disease. The visualized osseous structures are unremarkable. IMPRESSION: No active cardiopulmonary disease. Electronically Signed   By: Signa Kell M.D.   On: 03/10/2023 06:42    Procedures .Critical Care  Performed by: Dorthy Cooler, PA-C Authorized by:  Dorthy Cooler, [place]   Critical care provider statement:    Critical care time (minutes):  30   Critical care was necessary to treat or prevent imminent or life-threatening deterioration of the following conditions: severe hyponatremia.   Critical care was time spent personally by me on the following activities:  Development of treatment plan with patient or surrogate, discussions with consultants, evaluation of patient's response to treatment, examination of patient, ordering and review of laboratory studies, ordering and review of radiographic studies, ordering and performing treatments and interventions, pulse oximetry, re-evaluation of patient's condition and review of old charts   Care discussed with: admitting provider   Comments:     Patient with critically low hyponatremia     Medications Ordered in ED Medications  heparin injection 5,000 Units (has no administration in time range)  acetaminophen (TYLENOL) tablet 650 mg (has no administration in time range)    Or  acetaminophen (TYLENOL) suppository 650 mg (has no administration in time range)  guaiFENesin (MUCINEX) 12 hr tablet 600 mg (has no administration in time range)    ED Course/ Medical Decision Making/ A&P Clinical Course as of 03/10/23 1002  Mon Mar 10, 2023  0706 Patient not in room [SM]  0848 This is a 37 year old female with a history of renal disease on peritoneal dialysis, presenting from home with concern for cough and  congestion for 4 days, subjective shortness of breath and feeling of throat tightness.  She denies history of asthma.  She reports that she is finished her daily dialysis per regular except for Sunday night when she was too fatigued to finish it regularly.  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  She is also on phosphate binders for phosphorus issues.  She reports she consumes a low-salt diet at baseline due to fluid retention, but does occasionally eat salty food including frozen meals.  Her vital signs were unremarkable on exam today.  Her labs are notable for hyponatremia with a sodium of 120.  This is a new issue per my discussion with the patient.  Is not clear whether it be driving this, whether this may be dietary related, or side effect of her medications, or else related to her home peritoneal dialysis.  She has also had cough and congestion, with a sick contact is a 36 year old with similar symptoms in the house.  No immediate risk factors for legionnaires but we will discuss his workup with the hospitalist, I do not see evidence of bacterial infection on her chest x-ray.  She is not hypoxic.  Her lungs are clear to auscultation and she does not have any stridor or evidence of deep space neck infection or epiglottitis.  No immediate suspicion for pulmonary embolism.  The patient will require admission for hyponatremia.  She is not having confusion, altered mental status, or seizure history to warrant emergent hypertonic saline at this time or intensive care admission. [MT]  0927 Respiratory Syncytial Virus by PCR(!): POSITIVE [MT]    Clinical Course User Index [MT] Trifan, Kermit Balo, MD [SM] Dorthy Cooler, PA-C                                 Medical Decision Making Amount and/or Complexity of Data Reviewed Labs: ordered. Radiology: ordered.   This patient presents to the ED for concern of shortness of breath, this involves  an extensive  number of treatment options, and is a complaint that carries with it a high risk of complications and morbidity.  The differential diagnosis includes Anxiety, Anaphylaxis/Angioedema, Aspirated FB, Arrhythmia, CHF, Asthma, COPD, PNA, COVID/Flu/RSV, STEMI, Tamponade, TPNX, DKA, Sepsis, Toxin   Co morbidities that complicate the patient evaluation  CKD on peritoneal dialysis   Additional history obtained:  Dr. Alden Benjamin PCP   Lab Tests:  I Ordered, and personally interpreted labs.  The pertinent results include:   - CMP: hyponatremia at 120; low chloride at 87, low bicarb at 18 - Trop: pending - CBC: mild anemia. No leukocytosis - Respiratory Panel: positive for RSV - HCG: negative - TSH: within normal limits - osmolality testing: pending   Imaging Studies ordered:  I ordered imaging studies including  -chest xray: To assess for process contributing patient's symptoms I independently visualized and interpreted imaging  I agree with the radiologist interpretation   Problem List / ED Course / Critical interventions / Medication management  Admitting patient for hyponatremia Presents to ED concerned for SOB, Itching throat, cough, congestion, nausea x2 days.  Does have sick contact with child at home. Patient was not able to do peritoneal dialysis treatment yesterday d/t symptoms.  CMP with hyponatremia 120.  Chloride low at 87.  Bicarb low at 18.  BUN/creatinine elevated near patient's recent baseline at 87/14.84.  hCG negative.  CBC without leukocytosis.  There is mild anemia with hemoglobin at 10.7.  Chest x-ray without acute cardiopulmonary disease. No AMS, headaches, seizures. No other severe hyponatremia symptoms. Does endorse mild muscle twitching. I have reviewed the patients home medicines and have made adjustments as needed Dr. Ned Card admitting provider   Social Determinants of Health:  none          Final Clinical Impression(s) / ED  Diagnoses Final diagnoses:  Hyponatremia    Rx / DC Orders ED Discharge Orders     None         Dorthy Cooler, [place] 03/10/23 1002    Terald Sleeper, MD 03/10/23 [phone]

## 2023-03-10 NOTE — ED Triage Notes (Signed)
Patient c/o cough for several days with increased sob , states she has chest pressure when she coughs. States she has been on peritoneal dialysis x 2 years and never had an issue with it. States she didn't go on the machine Sunday night didn't feel like it.

## 2023-03-10 NOTE — ED Notes (Signed)
ED TO INPATIENT HANDOFF REPORT  ED Nurse Name and Phone #:  Theophilus Bones [phone]  S Name/Age/Gender Whitney Bennett 37 y.o. female Room/Bed: 038C/038C  Code Status   Code Status: Full Code  Home/SNF/Other Home Patient oriented to: self, place, time, and situation Is this baseline? Yes      Chief Complaint Hyponatremia [E87.1]  Triage Note Patient c/o cough for several days with increased sob , states she has chest pressure when she coughs. States she has been on peritoneal dialysis x 2 years and never had an issue with it. States she didn't go on the machine Sunday night didn't feel like it.    Allergies Allergies  Allergen Reactions   Lisinopril Cough    Level of Care/Admitting Diagnosis ED Disposition     ED Disposition  Admit   Condition  --   Comment  Hospital Area: [facility] [[id number]]  Level of Care: Progressive [102]  Admit to Progressive based on following criteria: NEPHROLOGY stable condition requiring close monitoring for AKI, requiring Hemodialysis or Peritoneal Dialysis either from expected electrolyte imbalance, acidosis, or fluid overload that can be managed by NIPPV or high flow oxygen.  May place patient in observation at [facility] or [facility] if equivalent level of care is available:: No  Covid Evaluation: Confirmed COVID Negative  Diagnosis: Hyponatremia [454098]  Admitting Physician: Ginnie Smart [[id number]]  Attending Physician: Ninetta Lights, JEFFREY C [[id number]]          B Medical/Surgery History Past Medical History:  Diagnosis Date   Anxiety    Chronic kidney disease    with proteinuria Focal Segvnmental Glomerulosclerosis   Depression    Past Surgical History:  Procedure Laterality Date   CAPD INSERTION N/A 04/18/2021   Procedure: LAPAROSCOPIC INSERTION CONTINUOUS AMBULATORY PERITONEAL DIALYSIS  (CAPD) CATHETER WITH OMENTOPEXY;  Surgeon: Leonie Douglas, MD;  Location: [facility] OR;  Service: Vascular;  Laterality:  N/A;   NO PAST SURGERIES       A IV Location/Drains/Wounds Patient Lines/Drains/Airways Status     Active Line/Drains/Airways     Name Placement date Placement time Site Days   Peripheral IV 03/10/23 20 G Anterior;Proximal;Right Forearm 03/10/23  0951  Forearm  less than 1   Incision - 4 Ports Abdomen Right;Anterior;Lateral;Upper Medial;Upper Left;Upper;Anterior;Lateral Left;Lateral;Mid 04/18/21  1120  -- 691            Intake/Output Last 24 hours No intake or output data in the 24 hours ending 03/10/23 1044  Labs/Imaging Results for orders placed or performed during the hospital encounter of 03/10/23 (from the past 48 hours)  CBC with Differential     Status: Abnormal   Collection Time: 03/10/23  5:58 AM  Result Value Ref Range   WBC 8.1 4.0 - 10.5 K/uL   RBC 3.71 (L) 3.87 - 5.11 MIL/uL   Hemoglobin 10.7 (L) 12.0 - 15.0 g/dL   HCT 11.9 (L) 14.7 - 82.9 %   MCV 83.6 80.0 - 100.0 fL   MCH 28.8 26.0 - 34.0 pg   MCHC 34.5 30.0 - 36.0 g/dL   RDW 56.2 13.0 - 86.5 %   Platelets 268 150 - 400 K/uL   nRBC 0.0 0.0 - 0.2 %   Neutrophils Relative % 65 %   Neutro Abs 5.3 1.7 - 7.7 K/uL   Lymphocytes Relative 22 %   Lymphs Abs 1.8 0.7 - 4.0 K/uL   Monocytes Relative 9 %   Monocytes Absolute 0.7 0.1 - 1.0 K/uL   Eosinophils  Relative 3 %   Eosinophils Absolute 0.2 0.0 - 0.5 K/uL   Basophils Relative 1 %   Basophils Absolute 0.1 0.0 - 0.1 K/uL   Immature Granulocytes 0 %   Abs Immature Granulocytes 0.02 0.00 - 0.07 K/uL    Comment: Performed at [facility], [address] [place], [place] [address]  Comprehensive metabolic panel     Status: Abnormal   Collection Time: 03/10/23  5:58 AM  Result Value Ref Range   Sodium 120 (L) 135 - 145 mmol/L   Potassium 3.7 3.5 - 5.1 mmol/L   Chloride 87 (L) 98 - 111 mmol/L   CO2 18 (L) 22 - 32 mmol/L   Glucose, Bld 92 70 - 99 mg/dL    Comment: Glucose reference range applies only to samples taken after fasting for at least 8 hours.    BUN 87 (H) 6 - 20 mg/dL   Creatinine, Ser 60.45 (H) 0.44 - 1.00 mg/dL   Calcium 8.0 (L) 8.9 - 10.3 mg/dL   Total Protein 6.6 6.5 - 8.1 g/dL   Albumin 3.4 (L) 3.5 - 5.0 g/dL   AST 38 15 - 41 U/L   ALT 35 0 - 44 U/L   Alkaline Phosphatase 53 38 - 126 U/L   Total Bilirubin 0.3 <1.2 mg/dL   GFR, Estimated 3 (L) >60 mL/min    Comment: (NOTE) Calculated using the CKD-EPI Creatinine Equation (2021)    Anion gap 15 5 - 15    Comment: Performed at [facility], [address] [place], [place] [address]  hCG, serum, qualitative     Status: None   Collection Time: 03/10/23  5:58 AM  Result Value Ref Range   Preg, Serum NEGATIVE NEGATIVE    Comment:        THE SENSITIVITY OF THIS METHODOLOGY IS >10 mIU/mL. Performed at [facility], [address] [place], [place] [address]   TSH     Status: None   Collection Time: 03/10/23  5:58 AM  Result Value Ref Range   TSH 1.573 0.350 - 4.500 uIU/mL    Comment: Performed by a 3rd Generation assay with a functional sensitivity of <=0.01 uIU/mL. Performed at [facility], [address], [place], [place] [address]   Osmolality     Status: None   Collection Time: 03/10/23  5:58 AM  Result Value Ref Range   Osmolality 284 275 - 295 mOsm/kg    Comment: Performed at [facility], [address] [place], [place] [address]  Sodium, urine, random     Status: None   Collection Time: 03/10/23  8:18 AM  Result Value Ref Range   Sodium, Ur 32 mmol/L    Comment: Performed at [facility], [address], [place], [place] [address]  Resp panel by RT-PCR (RSV, Flu A&B, Covid) Anterior Nasal Swab     Status: Abnormal   Collection Time: 03/10/23  8:18 AM   Specimen: Anterior Nasal Swab  Result Value Ref Range   SARS Coronavirus 2 by RT PCR NEGATIVE NEGATIVE   Influenza A by PCR NEGATIVE NEGATIVE   Influenza B by PCR NEGATIVE NEGATIVE    Comment: (NOTE) The Xpert Xpress SARS-CoV-2/FLU/RSV plus assay is intended as an  aid in the diagnosis of influenza from Nasopharyngeal swab specimens and should not be used as a sole basis for treatment. Nasal washings and aspirates are unacceptable for Xpert Xpress SARS-CoV-2/FLU/RSV testing.  Fact Sheet for  Patients: BloggerCourse.com  Fact Sheet for Healthcare Providers: SeriousBroker.it  This test is not yet approved or cleared by the [place] FDA and has been authorized for detection and/or diagnosis of SARS-CoV-2 by FDA under an Emergency Use Authorization (EUA). This EUA will remain in effect (meaning this test can be used) for the duration of the COVID-19 declaration under Section 564(b)(1) of the Act, 21 U.S.C. section 360bbb-3(b)(1), unless the authorization is terminated or revoked.     Resp Syncytial Virus by PCR POSITIVE (A) NEGATIVE    Comment: (NOTE) Fact Sheet for Patients: BloggerCourse.com  Fact Sheet for Healthcare Providers: SeriousBroker.it  This test is not yet approved or cleared by the [place] FDA and has been authorized for detection and/or diagnosis of SARS-CoV-2 by FDA under an Emergency Use Authorization (EUA). This EUA will remain in effect (meaning this test can be used) for the duration of the COVID-19 declaration under Section 564(b)(1) of the Act, 21 U.S.C. section 360bbb-3(b)(1), unless the authorization is terminated or revoked.  Performed at [facility], [address] [place], [place] [address]    DG Chest 2 View Result Date: 03/10/2023 CLINICAL DATA:  Cough and shortness of breath. EXAM: CHEST - 2 VIEW COMPARISON:  12/10/2022 FINDINGS: Heart size and mediastinal contours are unremarkable. There is no pleural fluid, interstitial edema or airspace disease. The visualized osseous structures are unremarkable. IMPRESSION: No active cardiopulmonary disease. Electronically Signed   By: Signa Kell M.D.    On: 03/10/2023 06:42    Pending Labs Unresulted Labs (From admission, onward)     Start     Ordered   03/11/23 0500  HIV Antibody (routine testing w rflx)  (HIV Antibody (Routine testing w reflex) panel)  Tomorrow morning,   R        03/10/23 0941   03/10/23 1031  Lipid panel  Add-on,   AD        03/10/23 1030   03/10/23 1000  Basic metabolic panel  Now then every 6 hours,   R (with TIMED occurrences)      03/10/23 0942   03/10/23 0908  Osmolality, urine  Once,   URGENT        03/10/23 0907   03/10/23 0905  Legionella Pneumophila Serogp 1 Ur Ag  Once,   URGENT        03/10/23 0906   03/10/23 0811  Urinalysis, w/ Reflex to Culture (Infection Suspected) -Urine, Clean Catch  Once,   URGENT       Question:  Specimen Source  Answer:  Urine, Clean Catch   03/10/23 0810   03/10/23 0656  Phosphorus  Once,   STAT        03/10/23 0656   03/10/23 0655  Magnesium  Once,   STAT        03/10/23 0656            Vitals/Pain Today's Vitals   03/10/23 0548 03/10/23 0549 03/10/23 0757 03/10/23 0815  BP:   132/85 126/84  Pulse:   81 83  Resp:   (!) 22   Temp:   97.9 F (36.6 C)   TempSrc:   Oral   SpO2:   100% 100%  Weight:  94.3 kg    Height:  5\' 6"  (1.676 m)    PainSc: 0-No pain       Isolation Precautions No active isolations  Medications Medications  heparin injection 5,000 Units (has no administration in time range)  acetaminophen (TYLENOL) tablet 650  mg (has no administration in time range)    Or  acetaminophen (TYLENOL) suppository 650 mg (has no administration in time range)  guaiFENesin (MUCINEX) 12 hr tablet 600 mg (has no administration in time range)  sevelamer carbonate (RENVELA) powder PACK 2.4 g (has no administration in time range)  calcitRIOL (ROCALTROL) capsule 0.25 mcg (has no administration in time range)  buPROPion (WELLBUTRIN) tablet 100 mg (has no administration in time range)    Mobility walks     Focused Assessments Pulmonary Assessment  Handoff:  Lung sounds:   O2 Device: Room Air      R Recommendations: See Admitting Provider Note  Report given to:   Additional Notes:

## 2023-03-10 NOTE — H&P (Signed)
Date: 03/10/2023               Patient Name:  Whitney Bennett MRN: [id number]  DOB: 21-Sep-1985 Age / Sex: 37 y.o., female   PCP: Julieanne Manson, MD         Medical Service: Internal Medicine Teaching Service         Attending Physician: Dr. Ginnie Smart, MD      First Contact: Dr. Katheran James, DO Pager [phone]    Second Contact: Dr. 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  The patient states that she has not been feeling well with these symptoms for 4 days. Her youngest daughter was sick about 2 weeks ago with similar symptoms, but otherwise, no one at home has been sick. The patient usually does PD every night, but was not able to finish it two nights ago, as she started having chest pressure that got worse with PD. She felt like something was on sitting on top of her chest at this time. She also did not do PD yesterday, as she did not feel well and was short of breath.   In addition to the shortness of breath and productive cough, the patient has had fatigue and a sore throat. She also describes having dysphagia for a few months now, noting that it is difficult to swallow solid foods, but she has assumed this was normal. Her dysphagia has gotten worse the last few days to the point that she is unable to swallow her pills. She has been drinking water and soups, and eating foods that are softer, as it difficult to swallow solid foods.   The patient denies any fevers, does have chills. She also describes intermittent chest tightness, stating that it has felt like something is sitting on top of her chest. She denies headaches,  palpitations, and abd pain, but does have nausea. She denies any vomiting, diarrhea, dysuria, or hematuria. Her weight is stable - noting that her dry weight is ~208 lbs and today she was 209 lbs at home.    Meds:  Renal vitamin  Wellbutrin 100 mg every 48 hrs Calcitriol 0.25 mcg BID Vitamin D 50 mcg Clonazepam 1 mg at bedtime prn Renvela 2.4 g TID Tenapanor 30 mg BID No outpatient medications have been marked as taking for the 03/10/23 encounter [facility] Encounter).    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 Julieanne Manson, Sees [facility] here for nephrology and at [facility] for transplant list  Substances: No tobacco, no alcohol   Family History:  Diabetes ESRD - paternal uncle and aunt  Allergies: Allergies as of 03/10/2023 - Review Complete  02/26/2023  Allergen Reaction Noted   Lisinopril Cough 03/31/2019    Review of Systems: A complete ROS was negative except as per HPI.   OBJECTIVE:   Physical Exam: Blood pressure 132/85, pulse 81, temperature 97.9 F (36.6 C), temperature source Oral, resp. rate (!) 22, height 5\' 6"  (1.676 m), weight 94.3 kg, last menstrual period 01/20/2023, SpO2 100%.   Constitutional: ill appearing female sitting in bed, in no acute distress HENT: normocephalic atraumatic, mucous membranes moist, no oropharyngeal erythema or exudates.  Cardiovascular: regular rate and rhythm, no m/r/g Pulmonary/Chest: normal work of breathing on  room air, lungs clear to auscultation bilaterally Abdominal: soft, non-tender, non-distended, PD catheter with dressing that is clean/dry/intact MSK: normal bulk and tone, no LE edema Neurological: alert & oriented x 3, no focal deficit Skin: warm and dry Psych: normal mood and affect  Labs: CBC    Component Value Date/Time   WBC 8.1 03/10/2023 0558   RBC 3.71 (L) 03/10/2023 0558   HGB 10.7 (L) 03/10/2023 0558   HGB 9.4 (L) 05/20/2022 1101   HCT 31.0 (L) 03/10/2023 0558   HCT 28.3 (L) 05/20/2022 1101   PLT 268 03/10/2023 0558   PLT 242 05/20/2022 1101   MCV 83.6 03/10/2023 0558   MCV 86 05/20/2022 1101   MCH 28.8 03/10/2023 0558   MCHC 34.5 03/10/2023 0558   RDW 13.3 03/10/2023 0558   RDW 14.0 05/20/2022 1101   LYMPHSABS 1.8 03/10/2023 0558   LYMPHSABS 2.4 05/20/2022 1101   MONOABS 0.7 03/10/2023 0558   EOSABS 0.2 03/10/2023 0558   EOSABS 0.2 05/20/2022 1101   BASOSABS 0.1 03/10/2023 0558   BASOSABS 0.1 05/20/2022 1101     CMP     Component Value Date/Time   NA 120 (L) 03/10/2023 0558   NA 139 05/20/2022 1101   K 3.7 03/10/2023 0558   CL 87 (L) 03/10/2023 0558   CO2 18 (L) 03/10/2023 0558   GLUCOSE 92 03/10/2023 0558   BUN 87 (H) 03/10/2023 0558   BUN 54 (H) 05/20/2022 1101   CREATININE 14.84 (H) 03/10/2023 0558   CALCIUM 8.0 (L) 03/10/2023 0558   PROT 6.6 03/10/2023 0558   PROT 6.4 05/20/2022 1101   ALBUMIN 3.4 (L) 03/10/2023 0558   ALBUMIN 3.7 (L) 05/20/2022 1101   AST 38 03/10/2023 0558   ALT 35 03/10/2023 0558   ALKPHOS 53 03/10/2023 0558   BILITOT 0.3 03/10/2023 0558   BILITOT 0.3 05/20/2022 1101   GFRNONAA 3 (L) 03/10/2023 0558   GFRAA 39 (L) 09/29/2018 0731    Imaging: CXR: No acute abnormality  EKG: personally reviewed my interpretation is normal sinus rhythm with LAD. Prior EKG reviewed.   ASSESSMENT & PLAN:   Assessment & Plan by Problem: Principal Problem:   Hyponatremia   Whitney Bennett is a 37 y.o. person living with a  history of ESRD on PD who presented with cough, SOB, fatigue and admitted for hyponatremia on hospital day 0  Hyponatremia Sodium 120 on admission, was 134 three months ago. The patient does not appear to be hypervolemic, as she has no lower extremity edema and her lungs are clear, but she has not had PD in two days, which puts her at risk of volume overload. Serum osm is 284, but urine osm is 146 (concentrated) and urine sodium is 32. TSH is normal, ruling out hypothyroidism as the etiology. Hyperglycemia, hyperproteinemia, and hypertriglyceridemia also ruled out.Given elevated urine osm and urine Na levels, SIADH is on the differential- could  be secondary to acute viral illness.  - Follow up q6h BMP - Follow up legionella urinary Ag - Fluid restriction 1 L/day - Tolvaptan 15 mg x 1 dose - If sodium worsens, would call PCCM for hypertonic saline  RSV Presented with SOB, productive cough, sore throat, chills, chest tightness, and congestion. Tested positive for RSV. Will treat supportively. 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 The patient has been on the transplant list at [facility] and [facility] for about 2 years while she has been on PD.  - Nephrology consulted for PD  Dysphagia The patient describes having dysphagia for months. It has acutely worsened in the setting of her sore throat/RSV infection, but I suspect she will need outpatient GI follow up.  Anemia of chronic disease Patient has a chronic normocytic anemia with Hb at baseline of 10.7.   Diet: Renal VTE: Heparin IVF: None Code: Full  Prior to Admission Living Arrangement: Home, living family Anticipated Discharge Location: Home Barriers to Discharge: medical stability  Dispo: Admit patient to Observation with expected length of stay less than 2 midnights.  Signed: Chauncey Mann DO Internal Medicine Resident PGY-3  03/10/2023, 9:43 AM

## 2023-03-10 NOTE — Consult Note (Signed)
Renal Service Consult Note [facility]  Whitney Bennett 03/10/2023 Whitney Krabbe, MD Requesting Physician: Dr. Ninetta Lights  Reason for Consult: ESRD pt on CCPD here w/ URI symptoms HPI: The patient is a 37 y.o. year-old w/ PMH as below who presented to ED this am c/o several of coughing, SOB, chest pain w/ coughing. Also chills at home. Missed Sunday night. Has been on PD x 2 yrs. In ED Bp's wnl, HR 70s- 90s, RR 18-25. Afeb. RA 100%. Resp panel was + for RSV by PCR. Labs showed Na 120, repeat 118. BUN 87, creat 14. Ca 8, K 3.5, CO2 15, phos 11.2.  Pt admitted for hyponatremia in the setting of acute RSV infection. We are asked to see for dialysis.   Pt on PD x 2 yrs, has had minimal issues. Gets some pain at night when the 1st dwell finishes. They added a little tidal PD w/ 2700 cc fill and only 2500 drain in on the 1st exchange. Allows a bit of fluid to remain in the abd during the whole Rx which can be helpful for pain w/ draining of water. Also states her last home weight which was yest or today, was 1 lb over her dry wt.   ROS - denies CP, no joint pain, no HA, no blurry vision, no rash, no diarrhea, no nausea/ vomiting, no dysuria, no difficulty voiding   Past Medical History  Past Medical History:  Diagnosis Date   Anxiety    Chronic kidney disease    with proteinuria Focal Segvnmental Glomerulosclerosis   Depression    Past Surgical History  Past Surgical History:  Procedure Laterality Date   CAPD INSERTION N/A 04/18/2021   Procedure: LAPAROSCOPIC INSERTION CONTINUOUS AMBULATORY PERITONEAL DIALYSIS  (CAPD) CATHETER WITH OMENTOPEXY;  Surgeon: Leonie Douglas, MD;  Location: [facility] OR;  Service: Vascular;  Laterality: N/A;   NO PAST SURGERIES     Family History  Family History  Problem Relation Age of Onset   Diabetes Mother    Thyroid disease Mother    Cervical polyp Mother    Post-traumatic stress disorder Mother    Hypercholesterolemia Father     Depression Father    Hypertension Father    Heart disease Brother    Diabetes Maternal Grandmother    Diabetes Maternal Grandfather    Stroke Maternal Grandfather    Diabetes Paternal Grandmother    Alcohol abuse Paternal Grandfather    Alcoholism Paternal Grandfather    Other Neg Hx    Social History  reports that she has never smoked. She has never used smokeless tobacco. She reports that she does not currently use alcohol. She reports that she does not use drugs. Allergies  Allergies  Allergen Reactions   Lisinopril Cough   Home medications Prior to Admission medications   Medication Sig Start Date End Date Taking? Authorizing Provider  acetaminophen (TYLENOL) 500 MG tablet Take 500 mg by mouth every 6 (six) hours as needed for mild pain or moderate pain.   Yes [provider]  albuterol (VENTOLIN HFA) 108 (90 Base) MCG/ACT inhaler Inhale 2 puffs into the lungs every 4 (four) hours as needed for wheezing or shortness of breath. 05/20/22  Yes Julieanne Manson, MD  B Complex-C-Folic Acid (RENAL VITAMIN PO) Take by mouth.   Yes [provider]  buPROPion ER (WELLBUTRIN SR) 100 MG 12 hr tablet TAKE 1 TABLET BY MOUTH EVERY 48 HOURS Patient taking differently: Take 100 mg by mouth daily. TAKE 1  TABLET BY MOUTH EVERY 48 HOURS 08/19/22  Yes Julieanne Manson, MD  calcitRIOL (ROCALTROL) 0.25 MCG capsule Take 0.25 mcg by mouth 3 (three) times daily.   Yes [provider]  Cholecalciferol 25 MCG (1000 UT) tablet Take 3,000 Units by mouth daily. 3 QD   Yes [provider]  gentamicin cream (GARAMYCIN) 0.1 % Apply 1 Application topically daily.   Yes [provider]  Methoxy PEG-Epoetin Beta (MIRCERA IJ) Inject 1 Dose into the skin every other day. 05/14/21  Yes [provider]  oxyCODONE-acetaminophen (PERCOCET/ROXICET) 5-325 MG tablet Take 1 tablet by mouth every 6 (six) hours as needed for moderate pain (pain score 4-6). 04/24/21  Yes  [provider]  sevelamer carbonate (RENVELA) 2.4 g PACK Take 2.4 g by mouth in the morning, at noon, and at bedtime. 10/08/21  Yes [provider]  clonazePAM (KLONOPIN) 1 MG tablet 1/2 to 1 tab by mouth at bedtime as needed for sleep Patient not taking: Reported on 03/10/2023 02/17/23   Julieanne Manson, MD  Tenapanor HCl, CKD, 30 MG TABS Take 30 mg by mouth 2 (two) times daily. Patient doesn't know what this medication is for, I told Patient and she is going to talk to her dr. Patient not taking: Reported on 03/10/2023    [provider]     Vitals:   03/10/23 5409 03/10/23 0757 03/10/23 0815 03/10/23 1252  BP:  132/85 126/84 119/84  Pulse:  81 83 91  Resp:  (!) 22  (!) 22  Temp:  97.9 F (36.6 C)  (!) 97.5 F (36.4 C)  TempSrc:  Oral  Oral  SpO2:  100% 100% 100%  Weight: 94.3 kg     Height: 5\' 6"  (1.676 m)      Exam Gen alert, no distress No rash, cyanosis or gangrene Sclera anicteric, throat clear  No jvd or bruits Chest clear bilat to bases, no rales/ wheezing RRR no MRG Abd soft ntnd no mass or ascites +bs GU defer MS no joint effusions or deformity Ext no LE or UE edema, no wounds or ulcers Neuro is alert, Ox 3 , nf    PD cath mid abdomen, intact    Renal-related home meds: - rocaltrol 0.25 mcg tid - renvela 2.4gm ac tid - other: klonopin 0.5- 1 mg prn hs, wellbutrin sr, percocet prn, ventolin hfa   OP PD: [facility] 7 nights/ week Dr Marisue Humble   98kg (pt says it is 95kg= 209lbs)  3 exchanges overnight  1.5 hr dwell  fill vol 2500 - uses 1.5% and 2.5% bags together usually w/ about 1200 cc UF - makes 1.5- 2 quarts of urine daily, has high residual function per pt - states she dwells 2700 cc 1st dwell, then machine only removes 2500 of the 1st dwell (keeps some fluid in to prevent abd pain, "tidal PD") then does 2500 cc the next 2 dwells, then machines removes the extra 200cc at the last drain   Assessment/ Plan: Hyponatremia - unusual in  esrd pt and usually represents hypervolemia, however she still has significant renal function and could be treated like a CKD pt. UNa is wnl and UOsm to borderline of low and normal. She is euvolemic on exam and at her dry wt at last wt at home yesterday/ today. Her serum osm wasn't high, but labs show normal total prot, serum triglycerides and serum glucose. Doubt she has pseudohyponatremia. Urine osm could reflect polydipsia or SIADH. Recommend try treating as SIADH first, w/  3% saline or tolvaptan. Have d/w pmd. Plan is to proceed w/ tolvaptan x 1 today, get 4hr Na+'s, then adjust prn. Will follow.  ESKD - on PD nightly. Orders are above. Plan PD tonight.  BP - BP's are wnl in the 120- 130 sbp. No htn meds at home. Follow.  Volume - looks euvolemic on exam, on RA, no O2, chest clear, no edema.  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   MBD ckd - CCa in range. Phos is high. Cont binders w/ meals, cont po vdra.       Vinson Moselle  MD [facility] 03/10/2023, 2:32 PM  Recent Labs  Lab 03/10/23 0558 03/10/23 0953  HGB 10.7*  --   ALBUMIN 3.4*  --   CALCIUM 8.0* 8.0*  PHOS 11.2*  --   CREATININE 14.84* 14.17*  K 3.7 3.5   Inpatient medications:  buPROPion  100 mg Oral Q48H   calcitRIOL  0.25 mcg Oral BID   heparin  5,000 Units Subcutaneous Q8H   sevelamer carbonate  2.4 g Oral TID WC   tolvaptan  15 mg Oral Once    acetaminophen **OR** acetaminophen, guaiFENesin

## 2023-03-10 NOTE — Procedures (Signed)
I have reviewed the PD regimen and made appropriate changes.  Vinson Moselle MD  [facility] 03/10/2023, 3:20 PM

## 2023-03-10 NOTE — ED Notes (Signed)
Pt now in room.   

## 2023-03-10 NOTE — Progress Notes (Signed)
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  Under treatment plans patient was to receive 1 dose of tolvaptan and be monitored with serial serum sodium.  After chart review, her sodium went from 118 initially to -119 at 1700.  Medication history showed Tolvaptan as not given. Spoke with evening RPH, Nicolette Bang, who found the two doses of Tolvaptan in the ED.   Husband at bedside. She is currently undergoing PD. Reports that her GI symptoms have improved, no orthostasis or new neuro symptoms. Her cough and throat discomfort at the most bothersome. Denies HA, changes in vision, shortness of breath.   Vitals:   03/10/23 1735 03/10/23 2018  BP: 118/83 126/88  Pulse: 75 86  Resp: 16 17  Temp: 98.1 F (36.7 C) 98 F (36.7 C)  SpO2: 98% 100%  General: Pleasant, well-appearing  laying in bed. No acute distress. Head: Normocephalic. Atraumatic. MMM CV: RRR. No murmurs, rubs, or gallops. No LE edema Pulmonary: Lungs CTAB without wheezing Abdominal: Soft, nontender, nondistended. PD site clean, connected to dialysis machine Extremities: Palpable radial and DP pulses. Normal ROM. Skin: Warm and dry. No obvious rash or lesions. Neuro: A&Ox3. Moves all extremities. Normal sensation. Speaking in full sentences, appropriately Psych: Pleasabt mood and affect  Recheck BMP 2200:    Latest Ref Rng & Units 03/10/2023    9:46 PM 03/10/2023    5:40 PM 03/10/2023    9:53 AM  BMP  Glucose 70 - 99 mg/dL 95  161  85   BUN 6 - 20 mg/dL 86  89  87   Creatinine 0.44 - 1.00 mg/dL 09.60  45.40  98.11   Sodium 135 - 145 mmol/L 124  119  118   Potassium 3.5 - 5.1 mmol/L 3.5  3.6  3.5   Chloride 98 - 111 mmol/L 89  86  84   CO2 22 - 32 mmol/L 18  15  15    Calcium 8.9 - 10.3 mg/dL 8.4  7.9  8.0    With improvement in her serum Na without  Tolvaptan, will hold medication at this time. No acute neurologic symptoms.  - Continue monitoring q6 BMP - Diet with fluid restriction - Symptomatic treatment for RSV infection already ordered by day team  Morene Crocker, MD [facility] Internal Medicine Program - PGY-2 03/10/2023, 10:44 PM Please contact the on call pager after 5 pm and on weekends

## 2023-03-11 DIAGNOSIS — Z83438 Family history of other disorder of lipoprotein metabolism and other lipidemia: Secondary | ICD-10-CM | POA: Diagnosis not present

## 2023-03-11 DIAGNOSIS — E222 Syndrome of inappropriate secretion of antidiuretic hormone: Secondary | ICD-10-CM | POA: Diagnosis present

## 2023-03-11 DIAGNOSIS — J210 Acute bronchiolitis due to respiratory syncytial virus: Secondary | ICD-10-CM | POA: Diagnosis present

## 2023-03-11 DIAGNOSIS — N186 End stage renal disease: Secondary | ICD-10-CM | POA: Diagnosis present

## 2023-03-11 DIAGNOSIS — Z833 Family history of diabetes mellitus: Secondary | ICD-10-CM | POA: Diagnosis not present

## 2023-03-11 DIAGNOSIS — Z888 Allergy status to other drugs, medicaments and biological substances status: Secondary | ICD-10-CM | POA: Diagnosis not present

## 2023-03-11 DIAGNOSIS — Z79899 Other long term (current) drug therapy: Secondary | ICD-10-CM | POA: Diagnosis not present

## 2023-03-11 DIAGNOSIS — Z91158 Patient's noncompliance with renal dialysis for other reason: Secondary | ICD-10-CM | POA: Diagnosis not present

## 2023-03-11 DIAGNOSIS — Z823 Family history of stroke: Secondary | ICD-10-CM | POA: Diagnosis not present

## 2023-03-11 DIAGNOSIS — E871 Hypo-osmolality and hyponatremia: Secondary | ICD-10-CM | POA: Diagnosis present

## 2023-03-11 DIAGNOSIS — Z818 Family history of other mental and behavioral disorders: Secondary | ICD-10-CM | POA: Diagnosis not present

## 2023-03-11 DIAGNOSIS — Z8249 Family history of ischemic heart disease and other diseases of the circulatory system: Secondary | ICD-10-CM | POA: Diagnosis not present

## 2023-03-11 DIAGNOSIS — Z992 Dependence on renal dialysis: Secondary | ICD-10-CM | POA: Diagnosis not present

## 2023-03-11 DIAGNOSIS — D631 Anemia in chronic kidney disease: Secondary | ICD-10-CM | POA: Diagnosis present

## 2023-03-11 DIAGNOSIS — Z6835 Body mass index (BMI) 35.0-35.9, adult: Secondary | ICD-10-CM | POA: Diagnosis not present

## 2023-03-11 DIAGNOSIS — N2581 Secondary hyperparathyroidism of renal origin: Secondary | ICD-10-CM | POA: Diagnosis present

## 2023-03-11 DIAGNOSIS — R5381 Other malaise: Secondary | ICD-10-CM | POA: Diagnosis present

## 2023-03-11 DIAGNOSIS — Z8349 Family history of other endocrine, nutritional and metabolic diseases: Secondary | ICD-10-CM | POA: Diagnosis not present

## 2023-03-11 DIAGNOSIS — E8770 Fluid overload, unspecified: Secondary | ICD-10-CM | POA: Diagnosis present

## 2023-03-11 DIAGNOSIS — R1310 Dysphagia, unspecified: Secondary | ICD-10-CM | POA: Diagnosis present

## 2023-03-11 DIAGNOSIS — Z7682 Awaiting organ transplant status: Secondary | ICD-10-CM | POA: Diagnosis not present

## 2023-03-11 DIAGNOSIS — N051 Unspecified nephritic syndrome with focal and segmental glomerular lesions: Secondary | ICD-10-CM | POA: Diagnosis present

## 2023-03-11 LAB — RENAL FUNCTION PANEL
Albumin: 3.5 g/dL (ref 3.5–5.0)
Anion gap: 20 — ABNORMAL HIGH (ref 5–15)
BUN: 86 mg/dL — ABNORMAL HIGH (ref 6–20)
CO2: 17 mmol/L — ABNORMAL LOW (ref 22–32)
Calcium: 8.4 mg/dL — ABNORMAL LOW (ref 8.9–10.3)
Chloride: 87 mmol/L — ABNORMAL LOW (ref 98–111)
Creatinine, Ser: 14.97 mg/dL — ABNORMAL HIGH (ref 0.44–1.00)
GFR, Estimated: 3 mL/min — ABNORMAL LOW (ref >60)
Glucose, Bld: 103 mg/dL — ABNORMAL HIGH (ref 70–99)
Phosphorus: 10.7 mg/dL — ABNORMAL HIGH (ref 2.5–4.6)
Potassium: 3.8 mmol/L (ref 3.5–5.1)
Sodium: 124 mmol/L — ABNORMAL LOW (ref 135–145)

## 2023-03-11 LAB — BASIC METABOLIC PANEL
Anion gap: 16 — ABNORMAL HIGH (ref 5–15)
Anion gap: 19 — ABNORMAL HIGH (ref 5–15)
BUN: 81 mg/dL — ABNORMAL HIGH (ref 6–20)
BUN: 82 mg/dL — ABNORMAL HIGH (ref 6–20)
CO2: 17 mmol/L — ABNORMAL LOW (ref 22–32)
CO2: 17 mmol/L — ABNORMAL LOW (ref 22–32)
Calcium: 8.3 mg/dL — ABNORMAL LOW (ref 8.9–10.3)
Calcium: 8.7 mg/dL — ABNORMAL LOW (ref 8.9–10.3)
Chloride: 91 mmol/L — ABNORMAL LOW (ref 98–111)
Chloride: 88 mmol/L — ABNORMAL LOW (ref 98–111)
Creatinine, Ser: 14.91 mg/dL — ABNORMAL HIGH (ref 0.44–1.00)
Creatinine, Ser: 14.98 mg/dL — ABNORMAL HIGH (ref 0.44–1.00)
GFR, Estimated: 3 mL/min — ABNORMAL LOW (ref >60)
GFR, Estimated: 3 mL/min — ABNORMAL LOW (ref >60)
Glucose, Bld: 107 mg/dL — ABNORMAL HIGH (ref 70–99)
Glucose, Bld: 95 mg/dL (ref 70–99)
Potassium: 3.7 mmol/L (ref 3.5–5.1)
Potassium: 3.6 mmol/L (ref 3.5–5.1)
Sodium: 124 mmol/L — ABNORMAL LOW (ref 135–145)
Sodium: 124 mmol/L — ABNORMAL LOW (ref 135–145)

## 2023-03-11 LAB — HIV ANTIBODY (ROUTINE TESTING W REFLEX): HIV Screen 4th Generation wRfx: NONREACTIVE

## 2023-03-11 MED ORDER — DELFLEX-LC/2.5% DEXTROSE 394 MOSM/L IP SOLN: INTRAPERITONEAL | Status: DC

## 2023-03-11 MED ORDER — PHENOL 1.4 % MT LIQD
1.0000 | OROMUCOSAL | Status: DC | PRN
  Administered 2023-03-11: 1 via OROMUCOSAL
  Filled 2023-03-11: qty 177

## 2023-03-11 MED ORDER — MENTHOL 3 MG MT LOZG: 1.0000 | LOZENGE | OROMUCOSAL | Status: DC | PRN

## 2023-03-11 NOTE — Plan of Care (Signed)

## 2023-03-11 NOTE — Progress Notes (Signed)
   03/11/23 1855  Cycler Setup  Total Number of Night Cycles 3  Night Fill Volume 2500  Dianeal Solution Dextrose 2.5% in 6000 mL Low Cal/Low Mag  Dianeal Additive Other (Comment) (None)  Night Dwell Time per Cycle - Hour(s) 1  Night Dwell Time per Cycle - Minute(s) 30  Night Time Therapy - Minute(s) 35  Night Time Therapy - Hour(s) 5  Minimum Initial Drain Volume 0  Maximum Peritoneal Volume 3750  Night/Total Therapy Volume 7500  Day Exchange No  Completion  Treatment Status Started  Hand-off documentation  Hand-off Received Received from shift RN/LPN  Report received from (Full Name) Louanne Belton, RN

## 2023-03-11 NOTE — Progress Notes (Signed)
   03/11/23 1913  Peritoneal Catheter Left lower abdomen Continuous ambulatory  Placement Date/Time: 04/18/21 1130   Procedural Verification: Medical records & consent reviewed;Relevant studies,results and images reviewed  Time out: Correct Patient;Correct Site;Correct Procedure;Special equipment/requirements available  Person In...  Site Assessment Clean, Dry, Intact  Drainage Description None  Catheter status Accessed  Dressing Gauze/Drain sponge  Dressing Status Clean, Dry, Intact  Dressing Intervention New dressing

## 2023-03-11 NOTE — Progress Notes (Signed)
CSW saw pt for SDOH needs. Pt states she is doing "well" and that her and her family are "making it through". Pt inquired about mental health resources. CSW provided pt with mental health resources in pts room w/ family.   Johnnette Gourd, MSW, LCSWA Transitions of Care [phone]

## 2023-03-11 NOTE — Hospital Course (Addendum)
Hyponatremia Sodium 120 on admission, decreased futher to 118, but thereafter increased to 124 and 128 steadily with 1L/day fluid restriction and return to peritoneal dialysis. 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 Asymptomatic throughout. Previous studies: Serum osm is 284, but urine osm is 146 (concentrated) and urine sodium is 32. TSH normal, ruling out hypothyroidism as the etiology. Hyperglycemia, hyperproteinemia, and hypertriglyceridemia also ruled out.   RSV Presented with SOB, productive cough, sore throat, chills, chest tightness, and congestion. No hypoxia. Supportive care with tylenol and mucinex provided for RSV and patient responded well with great improvement after first night.   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 The patient has been on the [facility] list at [facility] and [facility] for about 2 years while she has been on PD. Nephrology facilitated PD   Dysphagia The patient describes having dysphagia for months. It has acutely worsened in the setting of her sore throat/RSV infection. Consider outpatient GI follow up.

## 2023-03-11 NOTE — Progress Notes (Signed)
PD tx initation note:   Pre TX VS: please see data insert  Pre TX standing weight: 93.8 kg  PD treatment initiated via aseptic technique. Consent signed and in chart. Patient is alert and oriented. No complaints of pain. No specimen collected. PD exit site clean, dry and intact. Gentamycin and new dressing applied. Bedside RN educated on PD machine and how to contact tech support when PD machine alarms.    03/11/23 1838  Vitals  Temp 98.3 F (36.8 C)  Temp Source Oral  BP 134/80  MAP (mmHg) 94  BP Location Right Arm  BP Method Automatic  Patient Position (if appropriate) Lying  Pulse Rate 85  Pulse Rate Source Monitor  ECG Heart Rate 93  Resp 18  Oxygen Therapy  SpO2 100 %  O2 Device Room Air  Patient Activity (if Appropriate) In bed  Pulse Oximetry Type Continuous  Time-Out for Dialysis  What Procedure? Peritoneal Dialysis  Pt Identifiers(min of two) First/Last Name;MRN/Account#  Correct Site? Yes  Correct Side? Yes  Correct Procedure? Yes  Consents Verified? Yes  Rad Studies Available? N/A  Safety Precautions Reviewed? Yes

## 2023-03-11 NOTE — Progress Notes (Signed)
[facility] Progress Note  Subjective: seen in room, Na up 124  Vitals:   03/11/23 0335 03/11/23 0752 03/11/23 1113 03/11/23 1529  BP: 128/85 137/83 121/84 130/89  Pulse: 85 87 89 93  Resp: 16 16 20 20   Temp: 98.2 F (36.8 C) 97.6 F (36.4 C) 97.8 F (36.6 C) 97.6 F (36.4 C)  TempSrc: Oral Oral Oral Oral  SpO2: 100% 100% 100% 100%  Weight: 93.9 kg     Height:        Exam: Gen alert, no distress No rash, cyanosis or gangrene Sclera anicteric, throat clear  No jvd or bruits Chest clear bilat to bases, no rales/ wheezing RRR no MRG Abd soft ntnd no mass or ascites +bs GU defer MS no joint effusions or deformity Ext no LE or UE edema, no wounds or ulcers Neuro is alert, Ox 3 , nf    PD cath mid abdomen, intact      Renal-related home meds: - rocaltrol 0.25 mcg tid - renvela 2.4gm ac tid - other: klonopin 0.5- 1 mg prn hs, wellbutrin sr, percocet prn, ventolin hfa    OP PD: [facility] 7 nights/ week Dr Marisue Humble   98kg (pt says it is 95kg= 209lbs)  3 exchanges overnight  1.5 hr dwell  fill vol 2500 - uses 1.5% and 2.5% bags together usually w/ about 1200 cc UF - makes 1.5- 2 quarts of urine daily, has high residual function per pt      Assessment/ Plan: Hyponatremia - Na+ 117 yesterday, which in an esrd pt usually represents hypervolemia. UNa wnl and UOsm to borderline of low and normal. Tolvaptan was not given and her Na+ today is up to 124, which probably reflect fluid removal w/ PD. Will plan PD again tonight using all 2.5%.   ESKD - PD again tonight as above.  BP - BP's are wnl in the 120- 130 sbp. No htn meds at home. Follow.  Volume - looks euvolemic on exam, on RA, no O2, chest clear, no edema.  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   MBD ckd - CCa in range. Phos is high. Cont binders w/ meals, cont po vdra.          Vinson Moselle MD  [facility] 03/11/2023, 4:31 PM  Recent Labs  Lab 03/10/23 0558 03/10/23 0953 03/11/23 0941 03/11/23 1338  HGB  10.7*  --   --   --   ALBUMIN 3.4*  --   --  3.5  CALCIUM 8.0*   < > 8.7* 8.4*  PHOS 11.2*  --   --  10.7*  CREATININE 14.84*   < > 14.98* 14.97*  K 3.7   < > 3.6 3.8   < > = values in this interval not displayed.   No results for input(s): "IRON", "TIBC", "FERRITIN" in the last 168 hours. Inpatient medications:  buPROPion  100 mg Oral Q48H   calcitRIOL  0.25 mcg Oral BID   gentamicin cream  1 Application Topical Daily   heparin  5,000 Units Subcutaneous Q8H   sevelamer carbonate  2.4 g Oral TID WC    acetaminophen **OR** acetaminophen, guaiFENesin, menthol-cetylpyridinium, phenol

## 2023-03-11 NOTE — TOC Initial Note (Addendum)
Transition of Care [facility]) - Initial/Assessment Note    Patient Details  Name: Whitney Bennett MRN: [id number] Date of Birth: 1985/08/09  Transition of Care [facility]) CM/SW Contact:    Leone Haven, RN Phone Number: 03/11/2023, 3:12 PM  Clinical Narrative:                 From home with spouse, has PCP and insurance on file, states has no HH services in place at this time or DME at home.  States family member will transport them home at [employer] and family is support system, states gets medications from [facility] [place].  She and her spouse still works full time.  Pta self ambulatory, Peritoneal Diaylsis at home.  Expected Discharge Plan: Home/Self Care Barriers to Discharge: Continued Medical Work up   Patient Goals and CMS Choice Patient states their goals for this hospitalization and ongoing recovery are:: return home   Choice offered to / list presented to : NA      Expected Discharge Plan and Services In-house Referral: NA Discharge Planning Services: CM Consult Post Acute Care Choice: NA Living arrangements for the past 2 months: Single Family Home                 DME Arranged: N/A DME Agency: NA       HH Arranged: NA          Prior Living Arrangements/Services Living arrangements for the past 2 months: Single Family Home   Patient language and need for interpreter reviewed:: Yes Do you feel safe going back to the place where you live?: Yes      Need for Family Participation in Patient Care: Yes (Comment) Care giver support system in place?: Yes (comment)   Criminal Activity/Legal Involvement Pertinent to Current Situation/Hospitalization: No - Comment as needed  Activities of Daily Living   ADL Screening (condition at time of admission) Independently performs ADLs?: Yes (appropriate for developmental age) Is the patient deaf or have difficulty hearing?: Yes Does the patient have difficulty seeing, even when wearing glasses/contacts?: No Does  the patient have difficulty concentrating, remembering, or making decisions?: Yes  Permission Sought/Granted Permission sought to share information with : Case Manager Permission granted to share information with : Yes, Verbal Permission Granted              Emotional Assessment   Attitude/Demeanor/Rapport: Engaged Affect (typically observed): Appropriate Orientation: : Oriented to Self, Oriented to Place, Oriented to  Time, Oriented to Situation Alcohol / Substance Use: Not Applicable Psych Involvement: No (comment)  Admission diagnosis:  Hyponatremia [E87.1] Patient Active Problem List   Diagnosis Date Noted   Hyponatremia 03/10/2023   Peritoneal dialysis status (HCC) 03/10/2023   Acute bronchiolitis due to respiratory syncytial virus (RSV) 03/10/2023   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) 12/03/2020   Fever    COVID-19    Acute renal failure (HCC) 02/17/2020   Mixed hyperlipidemia 08/24/2018   Anxiety and depression 08/24/2018   CKD (chronic kidney disease) stage 4, GFR 15-29 ml/min (HCC)    Breast tenderness in female 11/14/2015   MONILIAL VAGINITIS 09/20/2008   Subjective visual disturbance 09/20/2008   Hearing loss 09/20/2008   ABSCESS, TOOTH 06/10/2008   URINARY TRACT INFECTION, RECURRENT 06/10/2008   OTH D/O MENSTRUATION&OTH ABN BLEED FE GNT TRACT 06/10/2008   PCP:  Julieanne Manson, MD Pharmacy:   [id number] - [place] - [address] [place] ([place]) [place]  [address] Phone: [phone] Fax: [phone]     Social Drivers of Health (SDOH) Social History: SDOH Screenings   Food Insecurity: Food Insecurity Present (03/10/2023)  Housing: High Risk (03/10/2023)  Transportation Needs: No Transportation Needs (03/10/2023)  Utilities: At Risk (03/10/2023)  Depression (PHQ2-9): Low Risk  (08/07/2020)  Stress: Stress Concern Present (01/02/2018)  Tobacco Use: Low Risk  (03/10/2023)    SDOH Interventions:     Readmission Risk Interventions     No data to display

## 2023-03-11 NOTE — Plan of Care (Signed)
  Problem: Clinical Measurements: Goal: Ability to maintain clinical measurements within normal limits will improve Outcome: Progressing Goal: Respiratory complications will improve Outcome: Progressing   

## 2023-03-11 NOTE — Progress Notes (Signed)
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   Overnight Events: None  Interim History: She reports feeling better overall this morning versus when she was admitted yesterday.  Dyspnea, malaise, nausea has improved, she does still have some sore throat, which she attributes to coughing.  OBJECTIVE:  Vital Signs: Vitals:   03/10/23 2018 03/10/23 2321 03/11/23 0335 03/11/23 0752  BP: 126/88 115/65 128/85 137/83  Pulse: 86  85 87  Resp: 17 17 16 16   Temp: 98 F (36.7 C) 98.1 F (36.7 C) 98.2 F (36.8 C) 97.6 F (36.4 C)  TempSrc: Oral Oral Oral Oral  SpO2: 100% 100% 100% 100%  Weight:   93.9 kg   Height:       Supplemental O2: Room Air SpO2: 100 %  Filed Weights   03/10/23 0549 03/11/23 0335  Weight: 94.3 kg 93.9 kg     Intake/Output Summary (Last 24 hours) at 03/11/2023 4540 Last data filed at 03/11/2023 9811 Gross per 24 hour  Intake --  Output 594 ml  Net -594 ml   Net IO Since Admission: -594 mL [03/11/23 0938]  Physical Exam: Physical Exam Constitutional:      General: She is not in acute distress.    Appearance: She is well-developed. She is not ill-appearing.  HENT:     Head: Normocephalic and atraumatic.  Cardiovascular:     Rate and Rhythm: Normal rate and regular rhythm.  Pulmonary:     Effort: Pulmonary effort is normal. No tachypnea or respiratory distress.     Breath sounds: Normal breath sounds. No decreased breath sounds, wheezing, rhonchi or rales.  Musculoskeletal:     Right lower leg: No edema.     Left lower leg: No edema.  Skin:    General: Skin is warm and dry.     Capillary Refill: Capillary refill takes less than 2 seconds.  Neurological:     General: No focal deficit present.     Mental Status: She is alert and oriented to person, place, and time.  Psychiatric:        Mood and Affect: Mood  normal.        Behavior: Behavior normal.     Patient Lines/Drains/Airways Status     Active Line/Drains/Airways     Name Placement date Placement time Site Days   Peripheral IV 03/10/23 20 G Anterior;Proximal;Right Forearm 03/10/23  0951  Forearm  1   Incision - 4 Ports Abdomen Right;Anterior;Lateral;Upper Medial;Upper Left;Upper;Anterior;Lateral Left;Lateral;Mid 04/18/21  1120  -- 692             ASSESSMENT/PLAN:  Assessment: Principal Problem:   Hyponatremia Active Problems:   Peritoneal dialysis status (HCC)   Acute bronchiolitis due to respiratory syncytial virus (RSV)  Whitney Bennett is a 37 y.o. person living with a history of ESRD on PD who presented with cough, SOB, fatigue and admitted for hyponatremia  Plan: Hyponatremia Sodium 120 on admission, decreased futher to 118, but now 124 with fluid restriction. 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 Euvolemic despite missing PD two days. Previous studies: Serum osm is 284, but urine osm is 146 (concentrated) and urine sodium is 32. TSH is normal, ruling out hypothyroidism as the etiology. Hyperglycemia, hyperproteinemia, and hypertriglyceridemia also ruled out.Given elevated urine osm and urine Na levels. - continue q6h BMP  and 1L/day fluid restriction. Anticipate discharge when sodium above 130. - Follow up legionella urinary Ag - Tolvaptan 15 mg x 1 dose was not given, will hold due to improvement  RSV Presented with SOB, productive cough, sore throat, chills, chest tightness, and congestion. Tested positive for RSV. Supportive care. 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 The patient has been on the transplant list at [facility] and [facility] for about 2 years while she has been on PD.  - Nephrology involved for PD, thank you  Dysphagia The patient describes having dysphagia for months. It has acutely worsened in  the setting of her sore throat/RSV infection, but I suspect she will need outpatient GI follow up.   Anemia of chronic disease Patient has a chronic normocytic anemia with Hb at baseline of 10.7.   Best Practice: Diet: Renal diet with 1L fluid restriction IVF: None VTE: heparin injection 5,000 Units Start: 03/10/23 0945 Code: Full AB: None DISPO: Anticipated discharge  tomorrow  to Home pending  continued improvement of hyponatremia .  Signature: Katheran James, D.O.  Internal Medicine Resident, PGY-1 [facility]  Pager: [phone] 9:38 AM, 03/11/2023   Please contact the on call pager after 5 pm and on weekends at [phone].

## 2023-03-11 NOTE — Progress Notes (Signed)
   03/11/23 0322  Completion  Treatment Status Complete  Initial Drain Volume 12  Average Dwell Time-Hour(s) 1  Average Dwell Time-Min(s) 30  Average Drain Time 29  Total Therapy Volume 7500  Total Therapy Time-Hour(s) 6  Total Therapy Time-Min(s) 49  Weight after Drain 207 lb 0.2 oz (93.9 kg)  Effluent Appearance Amber  Cell Count on Daytime Exchange N/A  Fluid Balance - CCPD  Total UF (+ value on cycler, pt loss) 294 mL  Procedure Comments  Tolerated treatment well? Yes  Education / Care Plan  Dialysis Education Provided Yes  Hand-off documentation  Hand-off Given Given to shift RN/LPN  Report given to (Full Name) Narda Amber  Hand-off Received Received from shift RN/LPN  Report received from (Full Name) Larene Pickett Trooper Olander   Treatment uneventful.

## 2023-03-12 ENCOUNTER — Other Ambulatory Visit (HOSPITAL_COMMUNITY): Payer: Self-pay

## 2023-03-12 LAB — LEGIONELLA PNEUMOPHILA SEROGP 1 UR AG: L. pneumophila Serogp 1 Ur Ag: NEGATIVE

## 2023-03-12 LAB — BASIC METABOLIC PANEL
Anion gap: 16 — ABNORMAL HIGH (ref 5–15)
BUN: 83 mg/dL — ABNORMAL HIGH (ref 6–20)
CO2: 19 mmol/L — ABNORMAL LOW (ref 22–32)
Calcium: 8.5 mg/dL — ABNORMAL LOW (ref 8.9–10.3)
Chloride: 93 mmol/L — ABNORMAL LOW (ref 98–111)
Creatinine, Ser: 15.55 mg/dL — ABNORMAL HIGH (ref 0.44–1.00)
GFR, Estimated: 3 mL/min — ABNORMAL LOW (ref >60)
Glucose, Bld: 93 mg/dL (ref 70–99)
Potassium: 3.9 mmol/L (ref 3.5–5.1)
Sodium: 128 mmol/L — ABNORMAL LOW (ref 135–145)

## 2023-03-12 MED ORDER — PHENOL 1.4 % MT LIQD
1.0000 | OROMUCOSAL | 0 refills | Status: AC | PRN
  Filled 2023-03-12: qty 118, fill #0

## 2023-03-12 MED ORDER — GUAIFENESIN ER 600 MG PO TB12
600.0000 mg | ORAL_TABLET | Freq: Two times a day (BID) | ORAL | 0 refills | Status: DC | PRN
  Filled 2023-03-12: qty 30, 15d supply, fill #0

## 2023-03-12 NOTE — Progress Notes (Signed)
PD post treatment note  PD treatment completed. Patient tolerated treatment well. PD effluent is clear. No specimen collected.  PD exit site clean, dry and intact. Patient is awake, oriented and in no acute distress.  Report given to bedside nurse.   Post treatment VS: please see data insert  Total UF removed:    Post treatment standing weight: 92 kg    03/12/23 0723  Vitals  Temp 98 F (36.7 C)  Temp Source Oral  BP Location Right Arm  BP Method Automatic  Patient Position (if appropriate) Lying  Pulse Rate Source Monitor  Oxygen Therapy  O2 Device Room Air  Patient Activity (if Appropriate) In bed  Pulse Oximetry Type Intermittent

## 2023-03-12 NOTE — Progress Notes (Signed)
   03/12/23 0723  Peritoneal Catheter Left lower abdomen Continuous ambulatory  Placement Date/Time: 04/18/21 1130   Procedural Verification: Medical records & consent reviewed;Relevant studies,results and images reviewed  Time out: Correct Patient;Correct Site;Correct Procedure;Special equipment/requirements available  Person In...  Site Assessment Clean, Dry, Intact  Drainage Description None  Catheter status Clamped;Deaccessed;Capped  Dressing Gauze/Drain sponge  Dressing Status Clean, Dry, Intact  Dressing Intervention Not applicable  Completion  Treatment Status Complete  Weight after Drain 202 lb 13.2 oz (92 kg)  Effluent Appearance Clear;Amber  Cell Count on Daytime Exchange N/A  Procedure Comments  Tolerated treatment well? Yes  Peritoneal Dialysis Comments Unable to obtain post treatment data summary. Patient alert, no c/o voiced, no acute distress noted; family at bedside.  Hand-off documentation  Hand-off Given Given to shift RN/LPN  Report given to (Full Name) Melany Guernsey, RN

## 2023-03-12 NOTE — TOC Transition Note (Signed)
Transition of Care [facility]) - Discharge Note   Patient Details  Name: Whitney Bennett MRN: [id number] Date of Birth: [birth year]  Transition of [facility]) CM/SW Contact:  Leone Haven, RN Phone Number: 03/12/2023, 11:02 AM   Clinical Narrative:    For dc today, has no needs. TOC to fill meds.   Final next level of care: Home/Self Care Barriers to Discharge: Continued Medical Work up   Patient Goals and CMS Choice Patient states their goals for this hospitalization and ongoing recovery are:: return home   Choice offered to / list presented to : NA      Discharge Placement                       Discharge Plan and Services Additional resources added to the After Visit Summary for   In-house Referral: NA Discharge Planning Services: CM Consult Post Acute Care Choice: NA          DME Arranged: N/A DME Agency: NA       HH Arranged: NA          Social Drivers of Health (SDOH) Interventions SDOH Screenings   Food Insecurity: Food Insecurity Present (03/10/2023)  Housing: High Risk (03/10/2023)  Transportation Needs: No Transportation Needs (03/10/2023)  Utilities: At Risk (03/10/2023)  Depression (PHQ2-9): Low Risk  (08/07/2020)  Stress: Stress Concern Present (01/02/2018)  Tobacco Use: Low Risk  (03/10/2023)     Readmission Risk Interventions     No data to display

## 2023-03-12 NOTE — Progress Notes (Signed)
Patient has been given discharge instructions to include TOC medications and follow up with primary. Patient verbalizes understanding of discharge instructions.

## 2023-03-12 NOTE — Discharge Instructions (Signed)
Ms Timmothy Sours were hospitalized for hyponatremia - dangerously low sodium that, if worsened, could cause coma or even death. This was likely due to a combination of extra fluid in the body from missing dialysis as well as a particular response due to your infection with RSV. Fortunately, your sodium increased well. Now, your sodium level is 128 and can be considered normal once you return to 135. You are now safe for discharge home. Until your next set of lab work, please continue to do dialysis and restrict your fluid intake to 1 liter per day, or about 4.5 cups. Expect to get labwork at dialysis but also call your primary doctor to see if an earlier appointment and bloodwork can be scheduled. Severe persistent nausea, persistent abdominal pain, confusion, or seizures could be signs of low sodium and so seek emergency evaluation if these symptoms should occur.

## 2023-03-12 NOTE — Discharge Summary (Signed)
Name: Whitney Bennett MRN: [id number] DOB: Sep 02, 1985 37 y.o. PCP: Julieanne Manson, MD  Date of Admission: 03/10/2023  5:36 AM Date of Discharge:  03/12/23 Attending Physician: Dr.  Ninetta Lights  DISCHARGE DIAGNOSIS:  Primary Problem: Hyponatremia   Hospital Problems: Principal Problem:   Hyponatremia Active Problems:   Peritoneal dialysis status (HCC)   Acute bronchiolitis due to respiratory syncytial virus (RSV)    DISCHARGE MEDICATIONS:   Allergies as of 03/12/2023       Reactions   Lisinopril Cough        Medication List     STOP taking these medications    clonazePAM 1 MG tablet Commonly known as: KLONOPIN   oxyCODONE-acetaminophen 5-325 MG tablet Commonly known as: PERCOCET/ROXICET   Tenapanor HCl (CKD) 30 MG Tabs       TAKE these medications    acetaminophen 500 MG tablet Commonly known as: TYLENOL Take 500 mg by mouth every 6 (six) hours as needed for mild pain or moderate pain.   albuterol 108 (90 Base) MCG/ACT inhaler Commonly known as: VENTOLIN HFA Inhale 2 puffs into the lungs every 4 (four) hours as needed for wheezing or shortness of breath.   buPROPion ER 100 MG 12 hr tablet Commonly known as: WELLBUTRIN SR TAKE 1 TABLET BY MOUTH EVERY 48 HOURS What changed: See the new instructions.   calcitRIOL 0.25 MCG capsule Commonly known as: ROCALTROL Take 0.25 mcg by mouth 3 (three) times daily.   Cholecalciferol 25 MCG (1000 UT) tablet Take 3,000 Units by mouth daily. 3 QD   gentamicin cream 0.1 % Commonly known as: GARAMYCIN Apply 1 Application topically daily.   guaiFENesin 600 MG 12 hr tablet Commonly known as: MUCINEX Take 1 tablet (600 mg total) by mouth 2 (two) times daily as needed for cough or to loosen phlegm.   MIRCERA IJ Inject 1 Dose into the skin every other day.   phenol 1.4 % Liqd Commonly known as: CHLORASEPTIC Use as directed 1 spray in the mouth or throat as needed for throat irritation / pain.   RENAL  VITAMIN PO Take by mouth.   sevelamer carbonate 2.4 g Pack Commonly known as: RENVELA Take 2.4 g by mouth in the morning, at noon, and at bedtime.        DISPOSITION AND FOLLOW-UP:  Ms.Whitney Bennett was discharged from [facility] in stable condition. At the hospital follow up visit please address:  Ms Whitney Bennett was hospitalized for asymptomatic hyponatremia. Admission Na was 118 and improved to 128 over 36 hours following peritoneal dialysis and 1L/day fluid restriction, supporting cause as volume overload having missed dialysis due to her RSV illness and possible SIADH. RSV illness caused great malaise and throat soreness but responsed well with supportive care, there was not hypoxia. She reported acute on chronic dysphagia that is ongoing for several months and so may benefit from GI evaluation at provider's discretion. Please check sodium, which is 128 at discharge.  Follow-up Recommendations: Consults: Consider GI for dysphagia Labs: Basic Metabolic Profile  She will not be discharged on any new medicines. She will be asked to remain consistent with dialysis and to fluid restrict herself to 1L/day until her next set of labs. At follow up, please check sodium.   HOSPITAL COURSE:  Patient Summary: Hyponatremia Sodium 120 on admission, decreased futher to 118, but thereafter increased to 124 and 128 steadily with 1L/day fluid restriction and return to peritoneal dialysis. 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 Asymptomatic throughout. Previous studies: Serum osm is 284, but urine osm is 146 (concentrated) and urine sodium is 32. TSH normal, ruling out hypothyroidism as the etiology. Hyperglycemia, hyperproteinemia, and hypertriglyceridemia also ruled out.   RSV Presented with SOB, productive cough, sore throat, chills, chest tightness, and congestion. No hypoxia. Supportive care with tylenol and mucinex  provided for RSV and patient responded well with great improvement after first night.   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 The patient has been on the transplant list at [facility] and [facility] for about 2 years while she has been on PD. Nephrology facilitated PD   Dysphagia The patient describes having dysphagia for months. It has acutely worsened in the setting of her sore throat/RSV infection. Consider outpatient GI follow up.     DISCHARGE INSTRUCTIONS:   Discharge Instructions     Call MD for:  persistant dizziness or light-headedness   Complete by: As directed    Call MD for:  persistant nausea and vomiting   Complete by: As directed    Call MD for:  severe uncontrolled pain   Complete by: As directed    Call MD for:  temperature >100.4   Complete by: As directed    Diet - low sodium heart healthy   Complete by: As directed    Increase activity slowly   Complete by: As directed    No wound care   Complete by: As directed        SUBJECTIVE:  She is feeling very well. No concerns. Throat pain is improved. Feels good on her feet. Ready to go home. Discharge Vitals:   BP 112/84 (BP Location: Right Arm)   Pulse 84   Temp 98 F (36.7 C) (Oral)   Resp 18   Ht 5\' 6"  (1.676 m)   Wt 101.1 kg   LMP 01/20/2023 (Approximate)   SpO2 94%   BMI 35.97 kg/m   OBJECTIVE:  Physical Exam Constitutional:      General: She is not in acute distress.    Appearance: She is well-developed. She is not ill-appearing.  Cardiovascular:     Rate and Rhythm: Normal rate and regular rhythm.  Pulmonary:     Effort: Pulmonary effort is normal.     Breath sounds: Normal breath sounds. No wheezing or rhonchi.  Abdominal:     General: Bowel sounds are normal.     Palpations: Abdomen is soft.  Skin:    General: Skin is warm and dry.     Capillary Refill: Capillary refill takes less than 2 seconds.  Neurological:     General: No focal deficit present.     Mental Status: She is alert and oriented to  person, place, and time.  Psychiatric:        Mood and Affect: Mood normal.        Behavior: Behavior normal.      Pertinent Labs, Studies, and Procedures:     Latest Ref Rng & Units 03/10/2023    5:58 AM 12/10/2022    5:13 AM 05/20/2022   11:01 AM  CBC  WBC 4.0 - 10.5 K/uL 8.1  13.8  6.5   Hemoglobin 12.0 - 15.0 g/dL 60.4  54.0  9.4   Hematocrit 36.0 - 46.0 % 31.0  31.4  28.3   Platelets 150 - 400 K/uL 268  234  242        Latest Ref Rng & Units 03/12/2023    7:59 AM  03/11/2023    1:38 PM 03/11/2023    9:41 AM  CMP  Glucose 70 - 99 mg/dL 93  409  95   BUN 6 - 20 mg/dL 83  86  82   Creatinine 0.44 - 1.00 mg/dL 81.19  14.78  29.56   Sodium 135 - 145 mmol/L 128  124  124   Potassium 3.5 - 5.1 mmol/L 3.9  3.8  3.6   Chloride 98 - 111 mmol/L 93  87  88   CO2 22 - 32 mmol/L 19  17  17    Calcium 8.9 - 10.3 mg/dL 8.5  8.4  8.7     DG Chest 2 View Result Date: 03/10/2023 CLINICAL DATA:  Cough and shortness of breath. EXAM: CHEST - 2 VIEW COMPARISON:  12/10/2022 FINDINGS: Heart size and mediastinal contours are unremarkable. There is no pleural fluid, interstitial edema or airspace disease. The visualized osseous structures are unremarkable. IMPRESSION: No active cardiopulmonary disease. Electronically Signed   By: Signa Kell M.D.   On: 03/10/2023 06:42     Signed: Katheran James, DO Internal Medicine Resident, PGY-1 [facility]  Pager: [phone] 10:13 AM, 03/12/2023

## 2023-03-12 NOTE — Progress Notes (Addendum)
Patient notified nurse her peritoneal dialysis was done at 0139, dialysis nurse was called at that time and she said leave it as is and she will be there to disconnect patient. Charge nurse aware and tried to call number as well but did not receive an answer this AM.

## 2023-03-12 NOTE — Progress Notes (Signed)
  [facility] Progress Note   Subjective:  Seen in room - feels better today. Na up to 128 and plan is for discharge today. No CP/dyspnea.  Objective Vitals:   03/12/23 0052 03/12/23 0313 03/12/23 0429 03/12/23 0723  BP: 120/70  124/84 112/84  Pulse: 100  83 84  Resp: 18  17 18   Temp: 98 F (36.7 C)  97.8 F (36.6 C) 98 F (36.7 C)  TempSrc: Oral  Oral Oral  SpO2: 100%  99% 94%  Weight:  101.1 kg    Height:       Physical Exam General: Well appearing woman, NAD. Room air. Heart: RRR; no murmur Lungs: CTAB Abdomen: soft Extremities: no LE edema Dialysis Access: PD cath  Additional Objective Labs: Basic Metabolic Panel: Recent Labs  Lab 03/10/23 0558 03/10/23 0953 03/11/23 0941 03/11/23 1338 03/12/23 0759  NA 120*   < > 124* 124* 128*  K 3.7   < > 3.6 3.8 3.9  CL 87*   < > 88* 87* 93*  CO2 18*   < > 17* 17* 19*  GLUCOSE 92   < > 95 103* 93  BUN 87*   < > 82* 86* 83*  CREATININE 14.84*   < > 14.98* 14.97* 15.55*  CALCIUM 8.0*   < > 8.7* 8.4* 8.5*  PHOS 11.2*  --   --  10.7*  --    < > = values in this interval not displayed.   Liver Function Tests: Recent Labs  Lab 03/10/23 0558 03/11/23 1338  AST 38  --   ALT 35  --   ALKPHOS 53  --   BILITOT 0.3  --   PROT 6.6  --   ALBUMIN 3.4* 3.5   CBC: Recent Labs  Lab 03/10/23 0558  WBC 8.1  NEUTROABS 5.3  HGB 10.7*  HCT 31.0*  MCV 83.6  PLT 268   Medications:  dialysis solution 2.5% low-MG/low-CA      buPROPion  100 mg Oral Q48H   calcitRIOL  0.25 mcg Oral BID   gentamicin cream  1 Application Topical Daily   heparin  5,000 Units Subcutaneous Q8H   sevelamer carbonate  2.4 g Oral TID WC    Dialysis Orders: [facility] 7 nights/ week Dr Marisue Humble   98kg (pt says it is 95kg= 209lbs)  3 exchanges overnight  1.5 hr dwell  fill vol 2500 - uses 1.5% and 2.5% bags together usually w/ about 1200 cc UF - makes 1.5- 2 quarts of urine daily, has high residual function per pt     Assessment/  Plan: Hyponatremia: Nadir Na 117, in ESRD patient, this is overload. Na improving with HD, up to 128 today - better. Had been using combination of 1.5% and 2.5% at home - discussed that should use all 2.5% at this time. Standing weight this AM 92kg -> likely can even aim a little lower for new EDW ~91.5kg. Will communicate this to her home therapy unit. ESRD: CCPD nightly. RSV: Symptomatic treatment, improved symptoms. BP/volume: BP good, no edema on exam, clear chest exam.  Anemia of ESRD: Hgb 10.7 - no ESA needed at this time. Secondary HPTH: CorrCa ok, Phos very high - continue binders.   Ozzie Hoyle, PA-C 03/12/2023, 11:17 AM  [employer]

## 2023-03-21 ENCOUNTER — Encounter: Payer: Self-pay | Admitting: Internal Medicine

## 2023-03-28 ENCOUNTER — Other Ambulatory Visit (INDEPENDENT_AMBULATORY_CARE_PROVIDER_SITE_OTHER): Payer: BC Managed Care – PPO | Admitting: Internal Medicine

## 2023-03-28 DIAGNOSIS — R768 Other specified abnormal immunological findings in serum: Secondary | ICD-10-CM | POA: Diagnosis not present

## 2023-03-30 LAB — ANA W/REFLEX: ANA Titer 1: POSITIVE — AB

## 2023-03-30 LAB — ENA+DNA/DS+ANTICH+CENTRO+FA...
Anti JO-1: <0.2 AI (ref 0.0–0.9)
Antiribosomal P Antibodies: <0.2 AI (ref 0.0–0.9)
Centromere Ab Screen: <0.2 AI (ref 0.0–0.9)
Chromatin Ab SerPl-aCnc: <0.2 AI (ref 0.0–0.9)
ENA RNP Ab: 0.4 AI (ref 0.0–0.9)
ENA SM Ab Ser-aCnc: <0.2 AI (ref 0.0–0.9)
ENA SSA (RO) Ab: <0.2 AI (ref 0.0–0.9)
ENA SSB (LA) Ab: <0.2 AI (ref 0.0–0.9)
Homogeneous Pattern: 1:160 {titer} — ABNORMAL HIGH
Scleroderma (Scl-70) (ENA) Antibody, IgG: <0.2 AI (ref 0.0–0.9)
Smith/RNP Antibodies: <0.2 AI (ref 0.0–0.9)
dsDNA Ab: 2 [IU]/mL (ref 0–9)

## 2023-03-31 ENCOUNTER — Ambulatory Visit: Payer: BC Managed Care – PPO | Admitting: Internal Medicine

## 2023-03-31 ENCOUNTER — Encounter: Payer: Self-pay | Admitting: Internal Medicine

## 2023-03-31 VITALS — BP 120/78 | HR 80 | Ht 66.0 in | Wt 197.0 lb

## 2023-03-31 DIAGNOSIS — G2581 Restless legs syndrome: Secondary | ICD-10-CM | POA: Diagnosis not present

## 2023-03-31 DIAGNOSIS — M25562 Pain in left knee: Secondary | ICD-10-CM

## 2023-03-31 DIAGNOSIS — M25552 Pain in left hip: Secondary | ICD-10-CM

## 2023-03-31 DIAGNOSIS — G8929 Other chronic pain: Secondary | ICD-10-CM

## 2023-03-31 DIAGNOSIS — M25551 Pain in right hip: Secondary | ICD-10-CM

## 2023-03-31 DIAGNOSIS — M25561 Pain in right knee: Secondary | ICD-10-CM

## 2023-03-31 DIAGNOSIS — T148XXA Other injury of unspecified body region, initial encounter: Secondary | ICD-10-CM

## 2023-03-31 NOTE — Progress Notes (Signed)
    Subjective:    Patient ID: Whitney Bennett, female   DOB: October 01, 1985, 38 y.o.   MRN: [id number]   HPI   Bruising:  Notes this when using heparin  6000 units in dialysate every other day.  She was to be using daily, but bruised more.  Noted in hospital when getting daily subcutaneous heparin  that she bruised easily as well.  When she stops the heparin , it goes away.  Had a bruise on her left thigh for which she was seen early in December and now with another on her right upper arm.   Sodium on 03/27/2023 was 131.   Potassium was 3.7.  She was in the 120s when in hospital for RSV recently  2.  Knees/ankles and shins and wrists with aching.  See note from 02/17/2023.  Elevated ANA at 1:160 with first a speckled pattern and then homogenous pattern.  ENA panel all negative.  She is not having any redness or swelling of joints.   She does admit it improves to get up and move around.   States she was given a medication for restless leg syndrome from renal in December, but is concerned she will have the side effect of hallucination, so has not tried.  She was given Requip 0.25 mg to take 1-2 tabs at bedtime.     Allergies  Allergen Reactions   Lisinopril Cough     Review of Systems    Objective:   BP 120/78 (BP Location: Left Arm, Patient Position: Sitting, Cuff Size: Normal)   Pulse 80   Ht 5' 6 (1.676 m)   Wt 197 lb (89.4 kg)   LMP 02/23/2023   BMI 31.80 kg/m   Physical Exam NAD HEENT:  PERRL, EOMI Neck:  Supple, No adenopathy Chest:  CTA CV:  RRR without murmur or rub.  Radial and DP pulses normal and equal LE:  No edema. Skin:  5 cm bruise without thickening of skin, right lateral upper arm.   Assessment & Plan   Bruising:  Talk with renal about playing with heparin  dose to titrate down and see if lower bruising.  Her history supports bruising occurs with more frequent heparin  dosing.    2.  Leg discomfort:  + ANA 1:160 with varying fluorescence pattern on repeat:  speckled vs homogenous.  ENA panel negative, however.  Does sound more like restless leg syndrome with more information today.  Encouraged her to try the Requip as directed by Renal and if side effects, stop.  Can try Mirapex/gabapentin/Lyrica if no improvement or if side effect.

## 2023-05-28 NOTE — Telephone Encounter (Signed)
Patient has been scheduled for CPE. 

## 2023-05-29 ENCOUNTER — Encounter: Admitting: Internal Medicine

## 2023-06-18 ENCOUNTER — Encounter (INDEPENDENT_AMBULATORY_CARE_PROVIDER_SITE_OTHER): Payer: Self-pay | Admitting: Internal Medicine

## 2023-06-18 ENCOUNTER — Ambulatory Visit (INDEPENDENT_AMBULATORY_CARE_PROVIDER_SITE_OTHER): Payer: Self-pay | Admitting: Internal Medicine

## 2023-06-18 VITALS — BP 111/68 | HR 81 | Temp 98.0°F | Ht 65.0 in | Wt 186.0 lb

## 2023-06-18 DIAGNOSIS — Z992 Dependence on renal dialysis: Secondary | ICD-10-CM

## 2023-06-18 DIAGNOSIS — Z6830 Body mass index (BMI) 30.0-30.9, adult: Secondary | ICD-10-CM | POA: Insufficient documentation

## 2023-06-18 DIAGNOSIS — E7800 Pure hypercholesterolemia, unspecified: Secondary | ICD-10-CM | POA: Insufficient documentation

## 2023-06-18 DIAGNOSIS — E66811 Obesity, class 1: Secondary | ICD-10-CM

## 2023-06-18 DIAGNOSIS — G479 Sleep disorder, unspecified: Secondary | ICD-10-CM | POA: Insufficient documentation

## 2023-06-18 DIAGNOSIS — N185 Chronic kidney disease, stage 5: Secondary | ICD-10-CM

## 2023-06-18 DIAGNOSIS — F5089 Other specified eating disorder: Secondary | ICD-10-CM | POA: Diagnosis not present

## 2023-06-18 DIAGNOSIS — Z0289 Encounter for other administrative examinations: Secondary | ICD-10-CM

## 2023-06-18 DIAGNOSIS — F3289 Other specified depressive episodes: Secondary | ICD-10-CM

## 2023-06-18 NOTE — Progress Notes (Signed)
 Office: [phone]  /  Fax: [phone]   Initial Visit  Whitney Bennett was seen in clinic today to evaluate for obesity. She is interested in losing weight to improve overall health and reduce the risk of weight related complications. She presents today to review program treatment options, initial physical assessment, and evaluation.     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 She was referred by her nephrologist for weight management.  She has struggled with weight issues throughout her life, attributing this to emotional eating and a poor relationship with food. She has been working with a dietitian for over a year, focusing on dietary restrictions such as low phosphorus and sodium intake. She has lost significant weight, from 237 pounds in December 2024 to 187 pounds currently, due to reduced portion sizes and adherence to dietary restrictions. Her 'dry weight' is 184 pounds, with a 5-pound fluctuation due to dialysis solution. She is concerned about weight gain as a side effect of post-transplant medications and wants to lose weight to maintain a healthy lifestyle.  She has a history of depression and anxiety, managed with therapy and medication. Food often serves as a coping mechanism for her anxiety. She describes her stress levels as moderate and acknowledges being very sedentary.  She has been on peritoneal dialysis for two years due to stage 5 chronic kidney disease. She is being evaluated for a kidney transplant and has a potential donor lined up for July. Her nephrologist has advised dietary restrictions to manage phosphorus levels and avoid processed foods and sodium. No diabetes, hypertension, or hyperlipidemia. Recent tests have not identified any underlying causes for her kidney disease.  She experiences sleep disturbances, sleeping  only 3 to 4 hours per night, and has been diagnosed with restless leg syndrome, for which she takes Requip. She avoids taking it regularly due to side effects like brain fog and difficulty functioning the next day.  She migrated to the [place] at age 36, which is when she began gaining weight. Her family history includes obesity. She has two daughters and works full-time.  Some associated conditions: Kidney disease  Contributing factors: Family history of obesity, Disruption of circadian rhythm / sleep disordered breathing, Consumption of processed foods, Moderate to high levels of stress, Reduced physical activity, Eating patterns, Mental health problems, and Strong orexigenic signaling and inadequate inhibitory control  emotional eating tendencies, problems with portion and hunger  Weight promoting medications identified: None  Current nutrition plan: None  Current level of physical activity: None  Current or previous pharmacotherapy: None  Response to medication: Never tried medications   Past medical history includes:   Past Medical History:  Diagnosis Date   Anxiety    Chronic kidney disease    with proteinuria Focal Segvnmental Glomerulosclerosis   Depression      Objective:   BP 111/68   Pulse 81   Temp 98 F (36.7 C)   Ht 5\' 5"  (1.651 m)   Wt 186 lb (84.4 kg)   SpO2 100%   BMI 30.95 kg/m  She was weighed on the bioimpedance scale: Body mass index is 30.95 kg/m.  Peak Weight:239 , Body Fat%:36, Visceral Fat Rating:7, Weight trend over the last 12 months: Decreasing  General:  Alert, oriented and cooperative. Patient is in no acute distress.  Respiratory: Normal respiratory effort, no problems with respiration noted  Gait: able to ambulate independently  Mental Status: Normal mood and affect. Normal behavior. Normal judgment and thought content.   DIAGNOSTIC DATA REVIEWED:  BMET    Component Value Date/Time   NA 128 (L) 03/12/2023 0759   NA 139  05/20/2022 1101   K 3.9 03/12/2023 0759   CL 93 (L) 03/12/2023 0759   CO2 19 (L) 03/12/2023 0759   GLUCOSE 93 03/12/2023 0759   BUN 83 (H) 03/12/2023 0759   BUN 54 (H) 05/20/2022 1101   CREATININE 15.55 (H) 03/12/2023 0759   CALCIUM 8.5 (L) 03/12/2023 0759   GFRNONAA 3 (L) 03/12/2023 0759   GFRAA 39 (L) 09/29/2018 0731   No results found for: "HGBA1C" No results found for: "INSULIN" CBC    Component Value Date/Time   WBC 8.1 03/10/2023 0558   RBC 3.71 (L) 03/10/2023 0558   HGB 10.7 (L) 03/10/2023 0558   HGB 9.4 (L) 05/20/2022 1101   HCT 31.0 (L) 03/10/2023 0558   HCT 28.3 (L) 05/20/2022 1101   PLT 268 03/10/2023 0558   PLT 242 05/20/2022 1101   MCV 83.6 03/10/2023 0558   MCV 86 05/20/2022 1101   MCH 28.8 03/10/2023 0558   MCHC 34.5 03/10/2023 0558   RDW 13.3 03/10/2023 0558   RDW 14.0 05/20/2022 1101   Iron/TIBC/Ferritin/ %Sat No results found for: "IRON", "TIBC", "FERRITIN", "IRONPCTSAT" Lipid Panel     Component Value Date/Time   CHOL 186 03/10/2023 0558   CHOL 232 (H) 08/21/2018 0905   TRIG 100 03/10/2023 0558   HDL 50 03/10/2023 0558   HDL 51 08/21/2018 0905   CHOLHDL 3.7 03/10/2023 0558   VLDL 20 03/10/2023 0558   LDLCALC 116 (H) 03/10/2023 0558   LDLCALC 138 (H) 08/21/2018 0905   Hepatic Function Panel     Component Value Date/Time   PROT 6.6 03/10/2023 0558   PROT 6.4 05/20/2022 1101   ALBUMIN 3.5 03/11/2023 1338   ALBUMIN 3.7 (L) 05/20/2022 1101   AST 38 03/10/2023 0558   ALT 35 03/10/2023 0558   ALKPHOS 53 03/10/2023 0558   BILITOT 0.3 03/10/2023 0558   BILITOT 0.3 05/20/2022 1101   BILIDIR <0.1 02/17/2020 1526   IBILI NOT CALCULATED 02/17/2020 1526      Component Value Date/Time   TSH 1.573 03/10/2023 0558   TSH 1.053 01/03/2012 1824     Assessment and Plan:   ESRD on peritoneal dialysis (HCC)  Pure hypercholesterolemia  emotional eating  Sleeping difficulties  Class 1 obesity with serious comorbidity and body mass index (BMI)  of 30.0 to 30.9 in adult, unspecified obesity type    Chronic Kidney Disease Stage 5 on Peritoneal Dialysis She has stage 5 chronic kidney disease and has been on peritoneal dialysis for two years. She is being evaluated for a kidney transplant with a potential donor lined up for July. Her nephrologist has advised dietary restrictions to manage phosphorus levels and maintain protein intake. A healthy lifestyle is crucial to prolong the function of the transplanted kidney. Maintaining a healthy weight is important to support the longevity of the transplanted kidney. - Continue peritoneal dialysis as prescribed. - Follow dietary restrictions to manage phosphorus levels and maintain appropriate protein intake. - Proceed with kidney transplant evaluation and preparation.  Hypercholesterolemia She had a mildly elevated LDL cholesterol at 116 seems that she has had chronic kidney disease since a young age which may increase her cardiovascular risk.  She will receive guidance in regards to reducing saturated fats in her diet at  her nutritional appointment.  Obesity She has a long-standing history of obesity, with a current weight of 187 lbs and a dry weight of 184 lbs. Her body fat percentage is 36%, with a visceral fat level of 7%, indicating predominantly subcutaneous fat. Obesity is influenced by genetic, medical, and lifestyle factors. She is interested in weight management to improve her health and support her upcoming kidney transplant. A balanced diet and calorie deficit are essential for weight loss. Wellbutrin may aid in weight loss. Discussed potential use of GLP-1 agonists, but previous insurance denied coverage. She is open to exploring this option again with her new insurance. The weight loss program will focus on creating a calorie deficit through a tailored nutrition plan and addressing emotional eating. Weight loss is expected to be gradual, around one to two pounds per week, to avoid rapid  weight loss complications. - Enroll in the weight management program. - Create a tailored nutrition plan that is low in phosphorus, moderate in protein, and low in carbohydrates. - Perform indirect calorimetry to assess metabolic rate. - Discuss emotional eating strategies and provide support for behavior change. - Consider GLP-1 agonist therapy if insurance coverage is available.  Depression She is currently receiving intermittent therapy and is on Wellbutrin, which has helped with her mood and may aid in weight management. Depression impacts her eating habits and overall health. Addressing emotional eating is part of her weight management plan. - Continue Wellbutrin as prescribed. - Continue therapy for depression management.  Restless Leg Syndrome /sleep difficulties She is taking ropinirole for restless leg syndrome but experiences side effects such as brain fog and difficulty functioning the next day, affecting her ability to take the medication consistently. The current dosage may need adjustment to minimize side effects. - Evaluate the current dosage of ropinirole and consider adjustments to minimize side effects. -Rule out iron deficiency state -Screen patient for OSA if not already done by transplant team -Would benefit from counseling on sleep hygiene -Wellbutrin may also contribute to insomnia.  General Health Maintenance She does not have common metabolic conditions such as diabetes or hypertension, and her blood pressure is well-controlled. Discussed the importance of monitoring cardiovascular disease and cancer risk, and ensuring up-to-date screenings. - Educate on balanced nutrition and regular physical activity. - Monitor blood pressure and metabolic health parameters.  Follow-up She will be closely monitored as part of the weight management program, with frequent visits to support her progress and adjust the plan as needed. Initial visits will be more frequent to provide  intensive support during the early stages of the program. - Schedule follow-up visits every 2-3 weeks for the first three months, then monthly thereafter. - Review laboratory results and adjust the plan based on findings. - Discuss cardiovascular disease and cancer risk, and ensure up-to-date screenings.    Obesity Treatment / Action Plan:  Patient will work on garnering support from family and friends to begin weight loss journey. Will work on eliminating or reducing the presence of highly palatable, calorie dense foods in the home. Will complete provided nutritional and psychosocial assessment questionnaire before the next appointment. Will be scheduled for indirect calorimetry to determine resting energy expenditure in a fasting state.  This will allow [place] to create a reduced calorie, high-protein meal plan to promote loss of fat mass while preserving muscle mass. Counseled on the health benefits of losing 5%-15% of total body weight. Was counseled on nutritional approaches to weight loss and benefits of reducing processed foods and consuming plant-based  foods and high quality protein as part of nutritional weight management. Was counseled on pharmacotherapy and role as an adjunct in weight management.   Obesity Education Performed Today:  She was weighed on the bioimpedance scale and results were discussed and documented in the synopsis.  We discussed obesity as a disease and the importance of a more detailed evaluation of all the factors contributing to the disease.  We discussed the importance of long term lifestyle changes which include nutrition, exercise and behavioral modifications as well as the importance of customizing this to her specific health and social needs.  We discussed the benefits of reaching a healthier weight to alleviate the symptoms of existing conditions and reduce the risks of the biomechanical, metabolic and psychological effects of obesity.  Whitney Bennett appears to be in the action stage of change and states they are ready to start intensive lifestyle modifications and behavioral modifications.  I have spent 45 minutes in the care of the patient today including: preparing to see patient (e.g. review and interpretation of tests, old notes ), obtaining and/or reviewing separately obtained history, performing a medically appropriate examination or evaluation, counseling and educating the patient, documenting clinical information in the electronic or other health care record, and independently interpreting results and communicating results to the patient, family, or caregiver   Reviewed by clinician on day of visit: allergies, medications, problem list, medical history, surgical history, family history, social history, and previous encounter notes pertinent to obesity diagnosis.   Worthy Rancher, MD

## 2023-06-24 HISTORY — PX: OTHER SURGICAL HISTORY: SHX169

## 2023-06-30 ENCOUNTER — Encounter (INDEPENDENT_AMBULATORY_CARE_PROVIDER_SITE_OTHER): Payer: Self-pay | Admitting: Internal Medicine

## 2023-07-28 ENCOUNTER — Ambulatory Visit (INDEPENDENT_AMBULATORY_CARE_PROVIDER_SITE_OTHER): Admitting: Internal Medicine

## 2023-08-13 ENCOUNTER — Ambulatory Visit (INDEPENDENT_AMBULATORY_CARE_PROVIDER_SITE_OTHER): Admitting: Internal Medicine

## 2023-09-17 ENCOUNTER — Encounter (INDEPENDENT_AMBULATORY_CARE_PROVIDER_SITE_OTHER): Payer: Self-pay

## 2023-09-18 ENCOUNTER — Encounter (INDEPENDENT_AMBULATORY_CARE_PROVIDER_SITE_OTHER): Payer: Self-pay

## 2023-09-22 ENCOUNTER — Ambulatory Visit (INDEPENDENT_AMBULATORY_CARE_PROVIDER_SITE_OTHER): Admitting: Internal Medicine

## 2023-09-25 ENCOUNTER — Ambulatory Visit: Admitting: Internal Medicine

## 2023-09-25 ENCOUNTER — Encounter: Payer: Self-pay | Admitting: Internal Medicine

## 2023-09-25 VITALS — BP 128/86 | HR 88 | Resp 16 | Ht 66.0 in | Wt 189.2 lb

## 2023-09-25 DIAGNOSIS — F410 Panic disorder [episodic paroxysmal anxiety] without agoraphobia: Secondary | ICD-10-CM | POA: Insufficient documentation

## 2023-09-25 DIAGNOSIS — Z0000 Encounter for general adult medical examination without abnormal findings: Secondary | ICD-10-CM

## 2023-09-25 DIAGNOSIS — N186 End stage renal disease: Secondary | ICD-10-CM

## 2023-09-25 MED ORDER — CLONAZEPAM 1 MG PO TABS: ORAL_TABLET | ORAL | 0 refills | Status: AC

## 2023-09-25 NOTE — Patient Instructions (Signed)
 Check on Hepatitis A and COVID vaccinations--copy of vaccines from [facility]

## 2023-09-25 NOTE — Progress Notes (Unsigned)
 Subjective:    Patient ID: Whitney Bennett, female   DOB: [birth year], 38 y.o.   MRN: [id number]   HPI  CPE without pap  1.  Pap:  Pap smear with [place] [facility] in March 2025 and normal other than BV and treated with vaginal metronidazole.    2.  Mammogram:  Never.  No family history of breast cancer.    3.  Osteoprevention:  Being treated with phosphate binders and Rocaltrol  with CKD.  Walks 1 mile daily.    4.  Guaiac Cards/FIT:  Never.    5.  Colonoscopy:  Never.  No family history of colon cancer.   6.  Immunizations:  She has received multiple vaccines from other offices that may not be showing in [facility].   Immunization History  Administered Date(s) Administered   Hepatitis A, Adult 10/05/2020   Hepatitis B 11/12/1999, 12/17/2019   Hepb-cpg 06/30/2023   Influenza, Quadrivalent, Recombinant, Inj, Pf 02/19/2022   Influenza,trivalent, recombinat, inj, PF 01/07/2023   Influenza-Unspecified 03/23/2019, 11/29/2020   MMR 11/12/1999, 12/17/2019   PFIZER(Purple Top)SARS-COV-2 Vaccination 06/11/2019, 07/02/2019, 05/28/2020   PNEUMOCOCCAL CONJUGATE-20 05/16/2021   Pneumococcal Conjugate-13 10/05/2020   Td 11/12/1999, 12/17/1999, 09/20/2008   Tdap 05/20/2018   Varicella 11/12/1999    7.  Glucose/Cholesterol:  Glucose 75 in April.  Cholesterol fine in 02/2023 Lipid Panel     Component Value Date/Time   CHOL 186 03/10/2023 0558   CHOL 232 (H) 08/21/2018 0905   TRIG 100 03/10/2023 0558   HDL 50 03/10/2023 0558   HDL 51 08/21/2018 0905   CHOLHDL 3.7 03/10/2023 0558   VLDL 20 03/10/2023 0558   LDLCALC 116 (H) 03/10/2023 0558   LDLCALC 138 (H) 08/21/2018 0905   LABVLDL 43 (H) 08/21/2018 0905     Current Meds  Medication Sig   acetaminophen  (TYLENOL ) 500 MG tablet Take 500 mg by mouth every 6 (six) hours as needed for mild pain or moderate pain.   B Complex-C-Folic Acid (RENAL VITAMIN PO) Take by mouth.   buPROPion  ER (WELLBUTRIN  SR) 100 MG 12 hr tablet TAKE 1  TABLET BY MOUTH EVERY 48 HOURS (Patient taking differently: Take 100 mg by mouth daily. TAKE 1 TABLET BY MOUTH EVERY 24 hours)   calcitRIOL  (ROCALTROL ) 0.25 MCG capsule Take 0.25 mcg by mouth 3 (three) times daily.   gentamicin  cream (GARAMYCIN ) 0.1 % Apply 1 Application topically daily.   heparin  1000 unit/ml SOLN injection Inject 6 mLs into the peritoneum. 6 ml or 6,000 units injected into dialysate bag every other day. (Patient taking differently: Inject 6 mLs into the peritoneum. 6 ml or 6,000 units injected into dialysate bag every 2 weeks)   iron sucrose in sodium chloride  0.9 % 100 mL Iron Sucrose (Venofer)   Methoxy PEG-Epoetin Beta (MIRCERA IJ) Inject 1 Dose into the skin every other day.   phenol (CHLORASEPTIC) 1.4 % LIQD Use as directed 1 spray in the mouth or throat as needed for throat irritation / pain.   rOPINIRole (REQUIP) 0.25 MG tablet Take 0.25 mg by mouth at bedtime.   sevelamer  carbonate (RENVELA ) 2.4 g PACK Take 2.4 g by mouth in the morning, at noon, and at bedtime.   Tenapanor HCl, CKD, 20 MG TABS Take 1 tablet by mouth daily.   Allergies  Allergen Reactions   Lisinopril Cough     Review of Systems  Psychiatric/Behavioral:  The patient is nervous/anxious (panic attacks--feels she is drowning, palpitations, dyspnea, esp at night with CAPD.  2-3  times weekly.  Seems worse since psychiatrist from [facility] recommended increasing Bupropion  to daily, but helps with depression.).       Objective:   BP 128/86 (BP Location: Right Arm, Patient Position: Sitting, Cuff Size: Normal)   Pulse 88   Resp 16   Ht 5' 6 (1.676 m)   Wt 189 lb 4 oz (85.8 kg)   LMP 08/26/2023 (Exact Date)   BMI 30.55 kg/m   Physical Exam   Assessment & Plan  One time refill of Clonazepam 

## 2023-10-06 ENCOUNTER — Ambulatory Visit (INDEPENDENT_AMBULATORY_CARE_PROVIDER_SITE_OTHER): Admitting: Internal Medicine

## 2023-11-21 ENCOUNTER — Other Ambulatory Visit: Admitting: Psychology

## 2023-11-21 ENCOUNTER — Telehealth (INDEPENDENT_AMBULATORY_CARE_PROVIDER_SITE_OTHER): Admitting: Psychology

## 2023-11-21 DIAGNOSIS — F419 Anxiety disorder, unspecified: Secondary | ICD-10-CM | POA: Diagnosis not present

## 2023-11-21 DIAGNOSIS — F32A Depression, unspecified: Secondary | ICD-10-CM | POA: Diagnosis not present

## 2023-12-18 ENCOUNTER — Ambulatory Visit (INDEPENDENT_AMBULATORY_CARE_PROVIDER_SITE_OTHER): Admitting: Internal Medicine

## 2023-12-29 ENCOUNTER — Telehealth: Admitting: Psychology

## 2023-12-29 ENCOUNTER — Other Ambulatory Visit: Admitting: Psychology

## 2023-12-29 DIAGNOSIS — F419 Anxiety disorder, unspecified: Secondary | ICD-10-CM

## 2023-12-29 DIAGNOSIS — F32A Depression, unspecified: Secondary | ICD-10-CM

## 2024-01-01 ENCOUNTER — Ambulatory Visit (INDEPENDENT_AMBULATORY_CARE_PROVIDER_SITE_OTHER): Admitting: Internal Medicine

## 2024-01-06 NOTE — Progress Notes (Signed)
 DAP note Client name: Whitney Bennett Therapist name: Whitney Bennett Date: 11/20/2023 Time: 60 mins.  Data: This was the first session with the client. She reports that she is feeling good. She states that from the time of the transplant until now she has been healing nicely. She reports that she is taking it one say at a time but that she believes that she was used to feeling so bad that this feels like an easier healing process. She reports that her brother is also healing nicely and that he is already back to 90% kidney function. She states that she feels like this is a new lease on life.  Assessment: The client presented calm and content. She is focused on healing and she has it in her mind that nothing is going to get in her way of doing that. As she continues to heal and feel better she really wants to get her life back on track, hopefully with less sickness and pain. She is excited to focus on her family and work. She is happy and at peace that her brother is doing so well, if he was having trouble healing this would be setting everyone back. Taking things one day at a time is best for everyone.  Plan: Attend monthly therapy sessions with a goal of attending one session per month. Take a moment of mindfulness daily and utilize box breathing as needed to decrease stress. Client will focus on prioritizing her overall health. This includes nutrition, physical activity, sleep, decreased stress, and other habits to inspire and motivate a continued healthy healing journey .

## 2024-01-29 ENCOUNTER — Telehealth: Admitting: Psychology

## 2024-01-29 ENCOUNTER — Other Ambulatory Visit: Admitting: Psychology

## 2024-01-29 DIAGNOSIS — F32A Depression, unspecified: Secondary | ICD-10-CM

## 2024-01-29 DIAGNOSIS — F419 Anxiety disorder, unspecified: Secondary | ICD-10-CM

## 2024-02-20 ENCOUNTER — Other Ambulatory Visit (HOSPITAL_COMMUNITY): Payer: Self-pay

## 2024-03-03 ENCOUNTER — Other Ambulatory Visit: Admitting: Psychology

## 2024-03-09 NOTE — Progress Notes (Signed)
 DAP note Client name: Whitney Bennett Therapist name: Camie Norris Whitney Bennett, Whitney Bennett Date: 01/29/2024 Time: 60 mins.   Data: Client reports that she is doing well. She states that everything is still going good for her and her brother post transplant. She states that she feels like she has found her balance for the most part. She states that she still pushes herself a little too much at times and so she continues to work on that. She states she is still figuring out the balance. She states that she is happy and just truly enjoying life.    Assessment: The client presented calm and content. She was alert and oriented. It was apparent that she feels good and that she is truly taking on life to the fullest. She seems to have found a nice balance with all of her responsibilities. She is learning when she is pushing herself to hard and learning to step back and find that balance.    Plan: Attend monthly therapy sessions with a goal of attending one session per month. Take a moment of mindfulness daily and utilize box breathing as needed to decrease stress. Client will focus on prioritizing her overall health. This includes her job, nutrition, physical activity, sleep, decreased stress, and other habits to inspire and motivate a continued healthy healing journey.

## 2024-03-09 NOTE — Progress Notes (Signed)
 Comprehensive Clinical Assessment (CCA) Note  12/29/2023 Whitney Bennett [id number]  Chief Complaint: Client states that she want to make sure that she is taking care of herself. She reports that she was depressed and anxious and very worried and she put in a lot of personal work and she wants to make sure to keep the stress down.  Visit Diagnosis: mild depression and mild anxiety    CCA Screening, Triage and Referral (STR)  Patient Reported Information How did you hear about us ? [facility] referral from Dr. Adella Referral name: Dr. Adella Referral phone number: [phone]  Whom do you see for routine medical problems? Dr. Erving Practice/Facility Name: [facility] Phone Number: [phone] Name of Contact: Dr. Adella Pass Number: [phone] Contact Fax Number: [phone] Prescriber Name: Dr. Adella Prescriber Address (if known): [address] [place]  What Is the Reason for Your Visit/Call Today? Therapy, complete CCA How Long Has This Been Causing You Problems? Years What Do You Feel Would Help You the Most Today? Problem solve, have someone to talk through things, set goals, stay consistent with boundaries.  Have You Recently Been in Any Inpatient Treatment (Hospital/Detox/Crisis Center/28-Day Program)? Yes in July Name/Location of Program/Hospital: [facility] How Long Were You There? 1 & 1/2 weeks When Were You Discharged? 10/27/23  Have You Ever Received Services From [facility] [facility] Before? Yes Who Do You See at [facility]? specialist and emergency services  Have You Recently Had Any Thoughts About Hurting Yourself? No Are You Planning to Commit Suicide/Harm Yourself At This time? No  Have you Recently Had Thoughts About Hurting Someone [place]? No Explanation: N/A  Have You Used Any Alcohol or Drugs in the Past 24 Hours? No, neither  How Long Ago Did You Use Drugs or Alcohol? Do not use What Did You Use and How  Much? N/A  Do You Currently Have a Therapist/Psychiatrist? Yes, therapist Name of Therapist/Psychiatrist: Camie Almarie Higinio [place], LCSWA Dr. Landry Adella, prescribes mental health medication  Have You Been Recently Discharged From Any Office Practice or Programs? No Explanation of Discharge From Practice/Program: N/A    CCA Screening Triage Referral Assessment Type of Contact: Video Is this Initial or Reassessment? Initial Date Telepsych consult ordered in [facility]:  12/29/2023 Time Telepsych consult ordered in [facility]:  12 pm  Patient Reported Information Reviewed? Yes Patient Left Without Being Seen? No Reason for Not Completing Assessment: Completed  Collateral Involvement: No  Does Patient Have a Court Appointed Legal Guardian? No Name and Contact of Legal Guardian: N/A If Minor and Not Living with Parent(s), Who has Custody? N/A Is CPS involved or ever been involved? No Is APS involved or ever been involved? No  Patient Determined To Be At Risk for Harm To Self or Others Based on Review of Patient Reported Information or Presenting Complaint? No Method: N/A Availability of Means: N/A Intent: None Notification Required: No Additional Information for Danger to Others Potential: No Additional Comments for Danger to Others Potential: N/A Are There Guns or Other Weapons in Your Home? No Types of Guns/Weapons: N/A Are These Weapons Safely Secured?                            N/A Who Could Verify You Are Able To Have These Secured: N/A Do You Have any Outstanding Charges, Pending Court Dates, Parole/Probation? No Contacted To Inform of Risk of Harm To Self or Others: N/A  Location of Assessment: [facility]  Does Patient Present under Involuntary Commitment? No IVC Papers Initial File Date: N/A  [place] of Residence: [place]  Patient Currently Receiving the Following Services: Therapy  Determination of Need: None at this time  Options For Referral: None    CCA  Biopsychosocial Intake/Chief Complaint: Client states that she want to make sure that she is taking care of herself. She reports that she was depressed and anxious and very worried and she put in a lot of personal work and she wants to make sure to keep the stress down.  Current Symptoms/Problems: mild depression and mild anxiety  Patient Reported Schizophrenia/Schizoaffective Diagnosis in Past: No  Strengths: Resilient, fighter, compassionate, strong, kind, caring, loving giving Preferences: stability, good health, financial stability, peace, calmness Abilities: Active listener, effective communicator, team player  Type of Services Patient Feels are Needed: Monthly therapy services  Initial Clinical Notes/Concerns: Client previously carried a lot of stress surrounding overall health. At times was very depressed and anxious. Client started therapy and worked through a lot of things. She is 2 months post kidney transplant and she is doing really well so wanting to keep her stress low and have a safe space that she can work things out as they come up.   Mental Health Symptoms Depression:  Yes, lack of energy, slept a lot, did not feel like doing daily responsibilities, tearful.  Duration of Depressive symptoms: Several years  Mania:  None  Anxiety:   Yes, worries a lot, can not stop worrying, feeling like something bad is going to happen  Psychosis:  None  Duration of Psychotic symptoms: N/A  Trauma:  Yes, past family issues  Obsessions:  None  Compulsions:  None  Inattention:  None  Hyperactivity/Impulsivity:  None  Oppositional/Defiant Behaviors:  None  Emotional Irregularity:  None  Other Mood/Personality Symptoms:  None   Mental Status Exam Appearance and self-care  Stature:  5 foot 6 inches  Weight:  189  Clothing:  clean, dressed appropriate for age  Grooming:  well groomed  Cosmetic use:  Minimal  Posture/gait:  Normal range  Motor activity:  Normal range  Sensorium   Attention:  Normal range  Concentration:  Normal range  Orientation:  A + O  Recall/memory:  Normal range  Affect and Mood  Affect:  Euthymic  Mood:  Pleasant, willing to answer  Relating  Eye contact:  Normal range  Facial expression:  Normal range  Attitude toward examiner:  Pleasant, kind  Thought and Language  Speech flow: Normal range  Thought content:  Normal range  Preoccupation:  Normal range  Hallucinations:  None  Organization:  Above range  [employer] of Knowledge:  Normal range  Intelligence:  Very smart and driven  Abstraction:  Normal range  Judgement:  Normal range  Reality Testing:  Normal range  Insight:  Normal range  Decision Making:  Normal range  Social Functioning  Social Maturity:  Normal range  Social Judgement:  Normal range  Stress  Stressors:  Chaos, unknown, lack of stability  Coping Ability:  Copes well, asks and accepts help  Skill Deficits:  personal boundary setting  Supports:  Family born into and chosen, medical team, friends    Religion: Scientist, Research (life Sciences): Spending time with daughters, husband.   Exercise/Diet: Eats well and exercises low intensity    CCA Employment/Education Employment/Work Situation: Employed full-time at [employer]  Education: [employer], unknown if any college   CCA Family/Childhood History Family and Relationship History: Client  has a supportive husband. She states that he has been there for her through all the sickness and really stepped up when needed. She states that she has a good relationship with both daughters and that they go above and beyond to help her. She reports that the main stress was always surrounding her health and limitations because of health.  She reports some rocky times with her birth family. She reports strained relationships at times. She states that this has all changed. She reports that they have worked through a lot and that  they except each other for who they are. Her brother donated his kidney and for this she will be forever grateful.  Childhood History:    Child/Adolescent Assessment:     CCA Substance Use Alcohol/Drug Use: None   ASAM's:  Six Dimensions of Multidimensional Assessment  Dimension 1:  Acute Intoxication and/or Withdrawal Potential:      Dimension 2:  Biomedical Conditions and Complications:      Dimension 3:  Emotional, Behavioral, or Cognitive Conditions and Complications:    Dimension 4:  Readiness to Change:    Dimension 5:  Relapse, Continued use, or Continued Problem Potential:     Dimension 6:  Recovery/Living Environment:    ASAM Severity Score:    ASAM Recommended Level of Treatment:     Substance use Disorder (SUD)   Recommendations for Services/Supports/Treatments:  DSM5 Diagnoses: Patient Active Problem List   Diagnosis Date Noted   Panic disorder 09/25/2023   Class 1 obesity with serious comorbidity and body mass index (BMI) of 30.0 to 30.9 in adult 06/18/2023   Sleeping difficulties 06/18/2023   Pure hypercholesterolemia 06/18/2023   Hyponatremia 03/10/2023   ESRD on peritoneal dialysis (HCC) 03/10/2023   Acute bronchiolitis due to respiratory syncytial virus (RSV) 03/10/2023   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) 12/03/2020   Fever    COVID-19    Acute renal failure 02/17/2020   Mixed hyperlipidemia 08/24/2018   Depression 08/24/2018   CKD (chronic kidney disease) stage 4, GFR 15-29 ml/min (HCC)    Breast tenderness in female 11/14/2015   MONILIAL VAGINITIS 09/20/2008   Subjective visual disturbance 09/20/2008   Hearing loss 09/20/2008   ABSCESS, TOOTH 06/10/2008   URINARY TRACT INFECTION, RECURRENT 06/10/2008   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 Adella  Patient/Guardian was advised Release of Information must be obtained prior to any record release in order to collaborate their care with an outside provider. Patient/Guardian was advised if they have not already done so to contact the registration department to sign all necessary forms in order for us  to release information regarding their care.   Consent: Patient/Guardian gives verbal consent for treatment and assignment of benefits for services provided during this visit. Patient/Guardian expressed understanding and agreed to proceed.   Camie Norris T Kenston [place], LCSWA

## 2024-04-09 ENCOUNTER — Telehealth: Admitting: Psychology

## 2024-09-27 ENCOUNTER — Encounter: Admitting: Internal Medicine
